# Patient Record
Sex: Male | Born: 1965 | Race: Black or African American | Hispanic: No | Smoking: Never smoker
Health system: Southern US, Community
[De-identification: ages and names within clinical notes are randomized; demographics above are authoritative.]

## PROBLEM LIST (undated history)

## (undated) DIAGNOSIS — E669 Obesity, unspecified: Secondary | ICD-10-CM

## (undated) DIAGNOSIS — G4730 Sleep apnea, unspecified: Secondary | ICD-10-CM

## (undated) DIAGNOSIS — I10 Essential (primary) hypertension: Secondary | ICD-10-CM

## (undated) HISTORY — PX: THROAT SURGERY: SHX803

---

## 2008-05-07 ENCOUNTER — Ambulatory Visit: Payer: Self-pay | Admitting: Unknown Physician Specialty

## 2016-11-25 ENCOUNTER — Observation Stay
Admission: EM | Admit: 2016-11-25 | Discharge: 2016-11-26 | Disposition: A | Discharge status: Home / Self Care | Payer: Managed Care, Other (non HMO) | Attending: Internal Medicine | Admitting: Internal Medicine

## 2016-11-25 ENCOUNTER — Encounter: Payer: Self-pay | Admitting: Emergency Medicine

## 2016-11-25 ENCOUNTER — Emergency Department: Payer: Managed Care, Other (non HMO)

## 2016-11-25 ENCOUNTER — Ambulatory Visit (INDEPENDENT_AMBULATORY_CARE_PROVIDER_SITE_OTHER)
Admission: EM | Admit: 2016-11-25 | Discharge: 2016-11-25 | Disposition: A | Discharge status: Other Acute Inpatient Hospital | Payer: Managed Care, Other (non HMO) | Source: Home / Self Care | Attending: Family Medicine | Admitting: Family Medicine

## 2016-11-25 DIAGNOSIS — G4730 Sleep apnea, unspecified: Secondary | ICD-10-CM | POA: Diagnosis present

## 2016-11-25 DIAGNOSIS — G4733 Obstructive sleep apnea (adult) (pediatric): Secondary | ICD-10-CM | POA: Diagnosis not present

## 2016-11-25 DIAGNOSIS — Z6841 Body Mass Index (BMI) 40.0 and over, adult: Secondary | ICD-10-CM | POA: Diagnosis not present

## 2016-11-25 DIAGNOSIS — R0681 Apnea, not elsewhere classified: Secondary | ICD-10-CM

## 2016-11-25 DIAGNOSIS — I10 Essential (primary) hypertension: Secondary | ICD-10-CM

## 2016-11-25 DIAGNOSIS — I469 Cardiac arrest, cause unspecified: Secondary | ICD-10-CM

## 2016-11-25 DIAGNOSIS — E669 Obesity, unspecified: Secondary | ICD-10-CM | POA: Diagnosis not present

## 2016-11-25 DIAGNOSIS — I499 Cardiac arrhythmia, unspecified: Secondary | ICD-10-CM

## 2016-11-25 HISTORY — DX: Obesity, unspecified: E66.9

## 2016-11-25 HISTORY — DX: Sleep apnea, unspecified: G47.30

## 2016-11-25 HISTORY — DX: Essential (primary) hypertension: I10

## 2016-11-25 LAB — BASIC METABOLIC PANEL
Anion gap: 8 (ref 5–15)
BUN: 14 mg/dL (ref 6–20)
CALCIUM: 8.8 mg/dL — AB (ref 8.9–10.3)
CO2: 27 mmol/L (ref 22–32)
CREATININE: 0.93 mg/dL (ref 0.61–1.24)
Chloride: 103 mmol/L (ref 101–111)
GFR calc Af Amer: >60 mL/min (ref >60)
Glucose, Bld: 99 mg/dL (ref 65–99)
Potassium: 3.6 mmol/L (ref 3.5–5.1)
SODIUM: 138 mmol/L (ref 135–145)

## 2016-11-25 LAB — CBC
HCT: 43 % (ref 40.0–52.0)
Hemoglobin: 14 g/dL (ref 13.0–18.0)
MCH: 25.4 pg — ABNORMAL LOW (ref 26.0–34.0)
MCHC: 32.5 g/dL (ref 32.0–36.0)
MCV: 78.4 fL — ABNORMAL LOW (ref 80.0–100.0)
PLATELETS: 269 10*3/uL (ref 150–440)
RBC: 5.49 MIL/uL (ref 4.40–5.90)
RDW: 15.1 % — AB (ref 11.5–14.5)
WBC: 4.7 10*3/uL (ref 3.8–10.6)

## 2016-11-25 LAB — TROPONIN I: Troponin I: <0.03 ng/mL (ref <0.03)

## 2016-11-25 LAB — TSH: TSH: 1.227 u[IU]/mL (ref 0.350–4.500)

## 2016-11-25 MED ORDER — ASPIRIN EC 81 MG PO TBEC
81.0000 mg | DELAYED_RELEASE_TABLET | Freq: Every day | ORAL | Status: DC
  Administered 2016-11-26: 81 mg via ORAL
  Filled 2016-11-25: qty 1

## 2016-11-25 MED ORDER — HEPARIN SODIUM (PORCINE) 5000 UNIT/ML IJ SOLN
5000.0000 [IU] | Freq: Three times a day (TID) | INTRAMUSCULAR | Status: DC
  Administered 2016-11-25 – 2016-11-26 (×2): 5000 [IU] via SUBCUTANEOUS
  Filled 2016-11-25 (×2): qty 1

## 2016-11-25 MED ORDER — SODIUM CHLORIDE 0.9% FLUSH
3.0000 mL | Freq: Two times a day (BID) | INTRAVENOUS | Status: DC
  Administered 2016-11-25: 3 mL via INTRAVENOUS

## 2016-11-25 MED ORDER — BISACODYL 10 MG RE SUPP: 10.0000 mg | Freq: Every day | RECTAL | Status: DC | PRN

## 2016-11-25 MED ORDER — LOSARTAN POTASSIUM 50 MG PO TABS
50.0000 mg | ORAL_TABLET | Freq: Every day | ORAL | Status: DC
  Administered 2016-11-25 – 2016-11-26 (×2): 50 mg via ORAL
  Filled 2016-11-25 (×2): qty 1

## 2016-11-25 MED ORDER — IPRATROPIUM-ALBUTEROL 0.5-2.5 (3) MG/3ML IN SOLN
3.0000 mL | Freq: Four times a day (QID) | RESPIRATORY_TRACT | Status: DC
  Administered 2016-11-25: 3 mL via RESPIRATORY_TRACT
  Filled 2016-11-25: qty 3

## 2016-11-25 MED ORDER — ACETAMINOPHEN 650 MG RE SUPP
650.0000 mg | Freq: Four times a day (QID) | RECTAL | Status: DC | PRN
Start: 2016-11-25 — End: 2016-11-26

## 2016-11-25 MED ORDER — ONDANSETRON HCL 4 MG PO TABS: 4.0000 mg | ORAL_TABLET | Freq: Four times a day (QID) | ORAL | Status: DC | PRN

## 2016-11-25 MED ORDER — DOCUSATE SODIUM 100 MG PO CAPS
100.0000 mg | ORAL_CAPSULE | Freq: Two times a day (BID) | ORAL | Status: DC
  Administered 2016-11-26: 100 mg via ORAL
  Filled 2016-11-25: qty 1

## 2016-11-25 MED ORDER — PANTOPRAZOLE SODIUM 40 MG PO TBEC
40.0000 mg | DELAYED_RELEASE_TABLET | Freq: Every day | ORAL | Status: DC
  Administered 2016-11-25 – 2016-11-26 (×2): 40 mg via ORAL
  Filled 2016-11-25 (×2): qty 1

## 2016-11-25 MED ORDER — ACETAMINOPHEN 325 MG PO TABS: 650.0000 mg | ORAL_TABLET | Freq: Four times a day (QID) | ORAL | Status: DC | PRN

## 2016-11-25 MED ORDER — ONDANSETRON HCL 4 MG/2ML IJ SOLN: 4.0000 mg | Freq: Four times a day (QID) | INTRAMUSCULAR | Status: DC | PRN

## 2016-11-25 NOTE — Progress Notes (Signed)
Overnight pulse-ox hooked up, pt on room air.

## 2016-11-25 NOTE — Discharge Instructions (Signed)
Recommend patient go to ED for further evaluation and management °

## 2016-11-25 NOTE — ED Triage Notes (Signed)
Patient states that he had a sleep study done last night.  Paitent states that his heart stopped beating during the sleep study last night.  Patient states that he was told that he should follow-up at the ER this morning.  Patient reports no symptoms at this time.  Patient denies chest pain or SOB.

## 2016-11-25 NOTE — H&P (Signed)
History and Physical    Jerry Conley [id number] DOB: November 23, 1965 DOA: 11/25/2016  Referring physician: Dr. Alfred Levins PCP: [facility], Chrissie Noa, MD  Specialists: none  Chief Complaint: sleep issues  HPI: Jerry Conley is a 51 y.o. male has a past medical history significant for obesity and borderline HTN who has been having sleep issues with frequent awakenings. Had a sleep study done last night with 6 second episode of asystole and was sent to ER. Pt denies CP or SOB. No palpitations. No syncope or near syncope. He is now admitted  Review of Systems: The patient denies anorexia, fever, weight loss,, vision loss, decreased hearing, hoarseness, chest pain, syncope, dyspnea on exertion, peripheral edema, balance deficits, hemoptysis, abdominal pain, melena, hematochezia, severe indigestion/heartburn, hematuria, incontinence, genital sores, muscle weakness, suspicious skin lesions, transient blindness, difficulty walking, depression, unusual weight change, abnormal bleeding, enlarged lymph nodes, angioedema, and breast masses.   History reviewed. No pertinent past medical history. Past Surgical History:  Procedure Laterality Date  . THROAT SURGERY     Social History:  reports that he has never smoked. He has never used smokeless tobacco. He reports that he drinks alcohol. He reports that he does not use drugs.  No Known Allergies  History reviewed. No pertinent family history.  Prior to Admission medications   Not on File   Physical Exam: Vitals:   11/25/16 0955  BP: (!) 151/121  Pulse: 92  Resp: 20  Temp: 98.4 F (36.9 C)  TempSrc: Oral  SpO2: 100%  Weight: (!) 145.2 kg (320 lb)  Height: 6\' 1"  (1.854 m)     General:  No apparent distress, WDWN, /AT  Eyes: PERRL, EOMI, no scleral icterus, conjunctiva clear  ENT: moist oropharynx without exudate, TM's benign, dentition good  Neck: supple, no lymphadenopathy. No bruits or thyromegaly  Cardiovascular: regular rate  without MRG; 2+ peripheral pulses, no JVD, no peripheral edema  Respiratory: CTA biL, good air movement without wheezing, rhonchi or crackled. Respiratory effort normal  Abdomen: soft, non tender to palpation, positive bowel sounds, no guarding, no rebound  Skin: no rashes or lesions  Musculoskeletal: normal bulk and tone, no joint swelling  Psychiatric: normal mood and affect, A&OX3  Neurologic: CN 2-12 grossly intact, Motor strength 5/5 in all 4 groups with symmetric DTR's and non-focal sensory exam  Labs on Admission:  Basic Metabolic Panel:  Recent Labs Lab 11/25/16 0956  NA 138  K 3.6  CL 103  CO2 27  GLUCOSE 99  BUN 14  CREATININE 0.93  CALCIUM 8.8*   Liver Function Tests: No results for input(s): AST, ALT, ALKPHOS, BILITOT, PROT, ALBUMIN in the last 168 hours. No results for input(s): LIPASE, AMYLASE in the last 168 hours. No results for input(s): AMMONIA in the last 168 hours. CBC:  Recent Labs Lab 11/25/16 0956  WBC 4.7  HGB 14.0  HCT 43.0  MCV 78.4*  PLT 269   Cardiac Enzymes:  Recent Labs Lab 11/25/16 0956  TROPONINI <0.03    BNP (last 3 results) No results for input(s): BNP in the last 8760 hours.  ProBNP (last 3 results) No results for input(s): PROBNP in the last 8760 hours.  CBG: No results for input(s): GLUCAP in the last 168 hours.  Radiological Exams on Admission: Dg Chest 2 View  Result Date: 11/25/2016 CLINICAL DATA:  Sleep apnea, second degree heart block, and 5.73 second episode of asystole detected during a sleep study EXAM: CHEST  2 VIEW COMPARISON:  None FINDINGS: Normal  heart size, mediastinal contours, and pulmonary vascularity. Lungs clear. No pleural effusion or pneumothorax. Bones unremarkable. IMPRESSION: Normal exam. Electronically Signed   By: Lavonia Dana M.D.   On: 11/25/2016 11:28    EKG: Independently reviewed.  Assessment/Plan Principal Problem:   Asystole (HCC) Active Problems:   OSA (obstructive sleep  apnea)   Obesity   HTN (hypertension)   Will observe on telemetry and follow oxygen closely. Echo ordered. Consult Pulmonology and Cardiology. Repeat labs in AM.  Diet: low salt Fluids: saline lock DVT Prophylaxis: SQ Heparin  Code Status: FULL  Family Communication: none  Disposition Plan: home  Time spent: 50 min

## 2016-11-25 NOTE — ED Triage Notes (Signed)
Patient presents to the ED from [facility] care.  Patient was sent to Urgent Care after having a sleep study done that found he had 25 second obstructive apnea with 2nd degree heart block and a 5.73 second asystole during the episode of apnea.  Patient is alert and oriented x 4.  Denies chest pain or shortness of breath.  No obvious distress at this time.

## 2016-11-25 NOTE — ED Provider Notes (Signed)
[facility]    CSN: [id number] Arrival date & time: 11/25/16  0810     History   Chief Complaint Chief Complaint  Patient presents with  . Heart Problem    HPI Jerry Conley is a 51 y.o. male.   51 yo male presents with a c/o "heart issues". Patient was having a sleep study last night and was found to have episodes of 2nd degree heart block during the sleep study as well as a 5 second asystole episode according to a written note by the sleep study technician. Patient states he was told to go to the Emergency Department for further evaluation. He asked if urgent care was ok and states was told yes. Currently denies any symptoms. Denies chest pains, palpitations, shortness of breath. States he feels fine. States that for many months he's had nighttime awakening episodes.    The history is provided by the patient.    History reviewed. No pertinent past medical history.  There are no active problems to display for this patient.   Past Surgical History:  Procedure Laterality Date  . THROAT SURGERY         Home Medications    Prior to Admission medications   Not on File    Family History History reviewed. No pertinent family history.  Social History Social History  Substance Use Topics  . Smoking status: Never Smoker  . Smokeless tobacco: Never Used  . Alcohol use Yes     Allergies   Patient has no known allergies.   Review of Systems Review of Systems   Physical Exam Triage Vital Signs ED Triage Vitals  Enc Vitals Group     BP 11/25/16 0835 (!) 145/86     Pulse Rate 11/25/16 0835 82     Resp 11/25/16 0835 16     Temp 11/25/16 0835 98.2 F (36.8 C)     Temp Source 11/25/16 0835 Oral     SpO2 11/25/16 0835 100 %     Weight 11/25/16 0825 (!) 320 lb (145.2 kg)     Height 11/25/16 0825 6\' 1"  (1.854 m)     Head Circumference --      Peak Flow --      Pain Score 11/25/16 0826 0     Pain Loc --      Pain Edu? --      Excl. in [place]? --     No data found.   Updated Vital Signs BP (!) 145/86 (BP Location: Left Arm)   Pulse 82   Temp 98.2 F (36.8 C) (Oral)   Resp 16   Ht 6\' 1"  (1.854 m)   Wt (!) 320 lb (145.2 kg)   SpO2 100%   BMI 42.22 kg/m   Visual Acuity Right Eye Distance:   Left Eye Distance:   Bilateral Distance:    Right Eye Near:   Left Eye Near:    Bilateral Near:     Physical Exam  Constitutional: He appears well-developed and well-nourished. No distress.  Cardiovascular: Normal rate, regular rhythm, normal heart sounds and intact distal pulses.   Pulmonary/Chest: Effort normal and breath sounds normal. No respiratory distress. He has no wheezes. He has no rales.  Abdominal:  obese  Skin: He is not diaphoretic.  Nursing note and vitals reviewed.    UC Treatments / Results  Labs (all labs ordered are listed, but only abnormal results are displayed) Labs Reviewed - No data to display  EKG  EKG Interpretation  None       Radiology No results found.  Procedures ED EKG Date/Time: 11/25/2016 9:22 AM Performed by: Norval Gable Authorized by: Norval Gable   ECG reviewed by ED Physician in the absence of a cardiologist: yes   Previous ECG:    Previous ECG:  Unavailable Interpretation:    Interpretation: normal   Rate:    ECG rate assessment: normal   Rhythm:    Rhythm: sinus rhythm   Ectopy:    Ectopy: none   QRS:    QRS axis:  Normal Conduction:    Conduction: normal   ST segments:    ST segments:  Normal T waves:    T waves: normal     (including critical care time)  Medications Ordered in UC Medications - No data to display   Initial Impression / Assessment and Plan / UC Course  I have reviewed the triage vital signs and the nursing notes.  Pertinent labs & imaging results that were available during my care of the patient were reviewed by me and considered in my medical decision making (see chart for details).       Final Clinical Impressions(s) / UC  Diagnoses   Final diagnoses:  Cardiac arrhythmia, unspecified cardiac arrhythmia type  Sleep apnea, unspecified type   (patient currently asymptomatic)  New Prescriptions There are no discharge medications for this patient.  1. ekg result (normal currently) and diagnosis reviewed with patient; discussed with patient potential serious risks if having sleep apneic episodes associated with heart block and asystole episodes. Recommend patient go to Emergency Department for further evaluation and management. Report called to triage RN at [facility] ED.    Norval Gable, MD 11/25/16 [phone]

## 2016-11-25 NOTE — Care Management Note (Addendum)
Case Management Note  Patient Details  Name: Jerry Conley MRN: [id number] Date of Birth: August 21, 1966  Subjective/Objective:       This Probation officer received a call from the ED-CM requesting information about how to obtain a CPAP machine for [name] today. [name] evidently experienced some medical problems while participating in a sleep study yesterday.. This Probation officer called the [place], [place], at [phone], and requested that he call the ED-CM to discuss the specifics of the request for a CPAP machine. Mr Rocks has [facility].              Action/Plan:   Expected Discharge Date:                  Expected Discharge Plan:     In-House Referral:     Discharge planning Services     Post Acute Care Choice:    Choice offered to:     DME Arranged:    DME Agency:     HH Arranged:    HH Agency:     Status of Service:     If discussed at [employer] of Stay Meetings, dates discussed:    Additional Comments:  Libby Goehring A, RN 11/25/2016, 1:10 PM

## 2016-11-25 NOTE — Care Management Note (Signed)
Case Management Note  Patient Details  Name: Jerry Conley MRN: [id number] Date of Birth: 1966/09/27  Subjective/Objective:  CPAP inquiry                  Action/Plan:Spoke with [name] with [facility] [phone] unabe to deliver CPAP today. Patient will need additional sleep study to determine settings. Monitor tonight . Pulmonary consult.cardiology consult and echo pending.   Expected Discharge Date:                  Expected Discharge Plan:     In-House Referral:     Discharge planning Services     Post Acute Care Choice:    Choice offered to:     DME Arranged:    DME Agency:     HH Arranged:    HH Agency:     Status of Service:     If discussed at [employer] of [employer], dates discussed:    Additional Comments:  Ival Bible, RN 11/25/2016, 2:22 PM

## 2016-11-25 NOTE — ED Provider Notes (Signed)
[facility] Emergency Department Provider Note  ____________________________________________  Time seen: Approximately 1:26 PM  I have reviewed the triage vital signs and the nursing notes.   HISTORY  Chief Complaint Irregular Heart Beat   HPI Jerry Conley is a 51 y.o. male with no significant past medical history who presents for evaluation of asystolic pause during a sleep study that happened last night. Patient was sent by his primary care doctor for sleep study. This morning he was told to come to the emergency room because he had a 5.7 second asystole during an episode of apnea overnight. Patient has no complaints at this time. He denies chest pain, shortness of breath, headache, abdominal pain, nausea, vomiting. Patient denies any past medical history. No family history of ischemic heart disease. He is not a smoker.  History reviewed. No pertinent past medical history.  Patient Active Problem List   Diagnosis Date Noted  . OSA (obstructive sleep apnea) 11/25/2016  . Asystole ([place]) 11/25/2016  . Obesity 11/25/2016  . HTN (hypertension) 11/25/2016    Past Surgical History:  Procedure Laterality Date  . THROAT SURGERY      Prior to Admission medications   Not on File    Allergies Patient has no known allergies.  History reviewed. No pertinent family history.  Social History Social History  Substance Use Topics  . Smoking status: Never Smoker  . Smokeless tobacco: Never Used  . Alcohol use Yes    Review of Systems  Constitutional: Negative for fever. Eyes: Negative for visual changes. ENT: Negative for sore throat. Neck: No neck pain  Cardiovascular: Negative for chest pain. Respiratory: Negative for shortness of breath. Gastrointestinal: Negative for abdominal pain, vomiting or diarrhea. Genitourinary: Negative for dysuria. Musculoskeletal: Negative for back pain. Skin: Negative for rash. Neurological: Negative for headaches,  weakness or numbness. Psych: No SI or HI  ____________________________________________   PHYSICAL EXAM:  VITAL SIGNS: ED Triage Vitals [11/25/16 0955]  Enc Vitals Group     BP (!) 151/121     Pulse Rate 92     Resp 20     Temp 98.4 F (36.9 C)     Temp Source Oral     SpO2 100 %     Weight (!) 320 lb (145.2 kg)     Height 6\' 1"  (1.854 m)     Head Circumference      Peak Flow      Pain Score 0     Pain Loc      Pain Edu?      Excl. in [place]?     Constitutional: Alert and oriented. Well appearing and in no apparent distress. HEENT:      Head: Normocephalic and atraumatic.         Eyes: Conjunctivae are normal. Sclera is non-icteric. EOMI. PERRL      Mouth/Throat: Mucous membranes are moist.       Neck: Supple with no signs of meningismus. Cardiovascular: Regular rate and rhythm. No murmurs, gallops, or rubs. 2+ symmetrical distal pulses are present in all extremities. No JVD. Respiratory: Normal respiratory effort. Lungs are clear to auscultation bilaterally. No wheezes, crackles, or rhonchi.  Gastrointestinal: Soft, non tender, and non distended with positive bowel sounds. No rebound or guarding. Musculoskeletal: Nontender with normal range of motion in all extremities. No edema, cyanosis, or erythema of extremities. Neurologic: Normal speech and language. Face is symmetric. Moving all extremities. No gross focal neurologic deficits are appreciated. Skin: Skin is warm,  dry and intact. No rash noted. Psychiatric: Mood and affect are normal. Speech and behavior are normal.  ____________________________________________   LABS (all labs ordered are listed, but only abnormal results are displayed)  Labs Reviewed  BASIC METABOLIC PANEL - Abnormal; Notable for the following:       Result Value   Calcium 8.8 (*)    All other components within normal limits  CBC - Abnormal; Notable for the following:    MCV 78.4 (*)    MCH 25.4 (*)    RDW 15.1 (*)    All other components  within normal limits  TROPONIN I   ____________________________________________  EKG  ED ECG REPORT I, Rudene Re, the attending physician, personally viewed and interpreted this ECG.  Normal sinus rhythm, rate of 79, normal intervals, normal axis, no ST elevations or depressions. Normal EKG. ____________________________________________  RADIOLOGY  none  ____________________________________________   PROCEDURES  Procedure(s) performed: None Procedures Critical Care performed:  None ____________________________________________   INITIAL IMPRESSION / ASSESSMENT AND PLAN / ED COURSE  51 y.o. male with no significant past medical history who presents for evaluation of asystolic pause during a sleep study that happened last night. Patient is asymptomatic at this time, physical exam and no acute findings, normal vital signs, normal EKG. Patient was placed on telemetry for monitoring. We'll discuss with case manager to see for unable to get patient CPAP from the emergency department. Spoke with Dr. Velora Heckler, pulmonologist on call who recommended admitting patient under obs overnight so we can be monitored overnight on CPAP and have CPAP arranged for tomorrow,.     Pertinent labs & imaging results that were available during my care of the patient were reviewed by me and considered in my medical decision making (see chart for details).    ____________________________________________   FINAL CLINICAL IMPRESSION(S) / ED DIAGNOSES  Final diagnoses:  Episode of apnea  Asystole ([place])      NEW MEDICATIONS STARTED DURING THIS VISIT:  New Prescriptions   No medications on file     Note:  This document was prepared using Dragon voice recognition software and may include unintentional dictation errors.    Rudene Re, MD 11/25/16 [phone]

## 2016-11-26 ENCOUNTER — Observation Stay (HOSPITAL_BASED_OUTPATIENT_CLINIC_OR_DEPARTMENT_OTHER)
Admit: 2016-11-26 | Discharge: 2016-11-26 | Disposition: A | Discharge status: Home / Self Care | Payer: Managed Care, Other (non HMO) | Attending: Internal Medicine | Admitting: Internal Medicine

## 2016-11-26 ENCOUNTER — Encounter: Payer: Self-pay | Admitting: Physician Assistant

## 2016-11-26 ENCOUNTER — Telehealth: Payer: Self-pay | Admitting: *Deleted

## 2016-11-26 DIAGNOSIS — E669 Obesity, unspecified: Secondary | ICD-10-CM | POA: Insufficient documentation

## 2016-11-26 DIAGNOSIS — J989 Respiratory disorder, unspecified: Secondary | ICD-10-CM

## 2016-11-26 DIAGNOSIS — I469 Cardiac arrest, cause unspecified: Principal | ICD-10-CM | POA: Insufficient documentation

## 2016-11-26 DIAGNOSIS — I455 Other specified heart block: Secondary | ICD-10-CM

## 2016-11-26 DIAGNOSIS — Z6841 Body Mass Index (BMI) 40.0 and over, adult: Secondary | ICD-10-CM | POA: Insufficient documentation

## 2016-11-26 DIAGNOSIS — G4733 Obstructive sleep apnea (adult) (pediatric): Secondary | ICD-10-CM | POA: Insufficient documentation

## 2016-11-26 DIAGNOSIS — I10 Essential (primary) hypertension: Secondary | ICD-10-CM | POA: Insufficient documentation

## 2016-11-26 LAB — COMPREHENSIVE METABOLIC PANEL
ALK PHOS: 51 U/L (ref 38–126)
ALT: 31 U/L (ref 17–63)
ANION GAP: 7 (ref 5–15)
AST: 27 U/L (ref 15–41)
Albumin: 3.6 g/dL (ref 3.5–5.0)
BUN: 14 mg/dL (ref 6–20)
CALCIUM: 8.7 mg/dL — AB (ref 8.9–10.3)
CHLORIDE: 104 mmol/L (ref 101–111)
CO2: 29 mmol/L (ref 22–32)
Creatinine, Ser: 0.98 mg/dL (ref 0.61–1.24)
GFR calc non Af Amer: >60 mL/min (ref >60)
Glucose, Bld: 89 mg/dL (ref 65–99)
Potassium: 3.7 mmol/L (ref 3.5–5.1)
SODIUM: 140 mmol/L (ref 135–145)
Total Bilirubin: 0.7 mg/dL (ref 0.3–1.2)
Total Protein: 6.9 g/dL (ref 6.5–8.1)

## 2016-11-26 LAB — CBC
HCT: 41 % (ref 40.0–52.0)
HEMOGLOBIN: 13.2 g/dL (ref 13.0–18.0)
MCH: 25.5 pg — AB (ref 26.0–34.0)
MCHC: 32.3 g/dL (ref 32.0–36.0)
MCV: 78.8 fL — ABNORMAL LOW (ref 80.0–100.0)
Platelets: 246 10*3/uL (ref 150–440)
RBC: 5.2 MIL/uL (ref 4.40–5.90)
RDW: 15.1 % — ABNORMAL HIGH (ref 11.5–14.5)
WBC: 4.9 10*3/uL (ref 3.8–10.6)

## 2016-11-26 LAB — ECHOCARDIOGRAM COMPLETE
Height: 73 in
WEIGHTICAEL: 5153.6 [oz_av]

## 2016-11-26 MED ORDER — IPRATROPIUM-ALBUTEROL 0.5-2.5 (3) MG/3ML IN SOLN: 3.0000 mL | Freq: Four times a day (QID) | RESPIRATORY_TRACT | Status: DC | PRN

## 2016-11-26 NOTE — Telephone Encounter (Signed)
TCM call. Patient currently admitted at 11/26/16 at 10:58 am. Discharge summary with instructions on medications not complete yet. Will call patient later.

## 2016-11-26 NOTE — Progress Notes (Signed)
Pt to be discharged today. Iv and tele removed. disch instructions given to pt to his understanding. disch via w.c.

## 2016-11-26 NOTE — Telephone Encounter (Signed)
Patient contacted regarding discharge from [facility] on 11/26/16.  Patient understands to follow up with provider ? On 12/04/16 at 4:00pm at [facility].  Patient understands discharge instructions? Yes  Patient understands medications and regiment? Yes, patient is not on any medications. Patient understands to bring all medications to this visit? Yes, patient is not on any medications.  Patient concerned because he was on CPAP in the hospital and does not have the equipment for tonight. Patient looked on discharge paperwork and saw a number for sleep clinic. Also, discharge summary lists [facility] is where CPAP is ordered for autotitration of 5-20 at home. Phone number for [place] given to patient and advised him to call the Sleep number and [facility] and to call us if he has any additional questions. He verbalized understanding.

## 2016-11-26 NOTE — Care Management (Signed)
Order placed for CPAP auto titration 5-20.  Patient to discharge home today.  Referral for [facility] made to [facility].  [facility] to follow up with patient to arranged CPAP in the home.  Patient aware that he needs to follow up with the outpatient sleep study to arrange the second portion.

## 2016-11-26 NOTE — Progress Notes (Signed)
*  PRELIMINARY RESULTS* Echocardiogram 2D Echocardiogram has been performed.  Jerry Conley 11/26/2016, 11:21 AM

## 2016-11-26 NOTE — Discharge Summary (Addendum)
[place] [facility] [place] NAME: Jerry Conley    MR#:  [id number]  DATE OF BIRTH:  [birth year]  DATE OF ADMISSION:  11/25/2016 ADMITTING PHYSICIAN: Idelle Crouch, MD  DATE OF DISCHARGE: 11/26/2016 PRIMARY CARE PHYSICIAN: FELDPAUSCH, DALE E, MD    ADMISSION DIAGNOSIS:  Asystole ([place]) [I46.9] Episode of apnea [R06.81]  DISCHARGE DIAGNOSIS:  Principal Problem:   Sleep apnea Active Problems:   Obesity   HTN (hypertension)   SECONDARY DIAGNOSIS:   Past Medical History:  Diagnosis Date  . Hypertension   . Obesity   . Sleep apnea     HOSPITAL COURSE:  51 year old male with obesity who apparently had a cardiac event during a sleep study.  1. Reported sinus positive: Patient has been evaluated by cardiology. No strips are available for review. Patient has had no significant events on our telemetry monitoring. Etiology is due to underlying sleep apnea. CPAP is ordered on autotitration (5-20) at home throught [place] Patient will follow-up with cardiology for echocardiogram results.  2. Essential hypertension: Patient reports he does not take medications. Patient will need a follow-up with cardiology   DISCHARGE CONDITIONS AND DIET:   Stable   CONSULTS OBTAINED:  Treatment Team:  Will Meredith Leeds, MD Allyne Gee, MD  DRUG ALLERGIES:  No Known Allergies  DISCHARGE MEDICATIONS:  There are no discharge medications for this patient.     Today   CHIEF COMPLAINT:  Regular cardiac diet   VITAL SIGNS:  Blood pressure 129/73, pulse 78, temperature 98.2 F (36.8 C), temperature source Oral, resp. rate 16, height 6\' 1"  (1.854 m), weight (!) 146.1 kg (322 lb 1.6 oz), SpO2 97 %.   REVIEW OF SYSTEMS:  Review of Systems  Constitutional: Negative.  Negative for chills, fever and malaise/fatigue.  HENT: Negative.  Negative for ear discharge, ear pain, hearing loss, nosebleeds and sore throat.   Eyes: Negative.   Negative for blurred vision and pain.  Respiratory: Negative.  Negative for cough, hemoptysis, shortness of breath and wheezing.   Cardiovascular: Negative.  Negative for chest pain, palpitations and leg swelling.  Gastrointestinal: Negative.  Negative for abdominal pain, blood in stool, diarrhea, nausea and vomiting.  Genitourinary: Negative.  Negative for dysuria.  Musculoskeletal: Negative.  Negative for back pain.  Skin: Negative.   Neurological: Negative for dizziness, tremors, speech change, focal weakness, seizures and headaches.  Endo/Heme/Allergies: Negative.  Does not bruise/bleed easily.  Psychiatric/Behavioral: Negative.  Negative for depression, hallucinations and suicidal ideas.     PHYSICAL EXAMINATION:  GENERAL:  51 y.o.-year-old patient lying in the bed with no acute distress.  NECK:  Supple, no jugular venous distention. No thyroid enlargement, no tenderness.  LUNGS: Normal breath sounds bilaterally, no wheezing, rales,rhonchi  No use of accessory muscles of respiration.  CARDIOVASCULAR: S1, S2 normal. No murmurs, rubs, or gallops.  ABDOMEN: Soft, non-tender, non-distended. Bowel sounds present. No organomegaly or mass.  EXTREMITIES: No pedal edema, cyanosis, or clubbing.  PSYCHIATRIC: The patient is alert and oriented x 3.  SKIN: No obvious rash, lesion, or ulcer.   DATA REVIEW:   CBC  Recent Labs Lab 11/26/16 0550  WBC 4.9  HGB 13.2  HCT 41.0  PLT 246    Chemistries   Recent Labs Lab 11/26/16 0550  NA 140  K 3.7  CL 104  CO2 29  GLUCOSE 89  BUN 14  CREATININE 0.98  CALCIUM 8.7*  AST 27  ALT 31  ALKPHOS 51  BILITOT 0.7    Cardiac Enzymes  Recent Labs Lab 11/25/16 0956  TROPONINI <0.03    Microbiology Results  @MICRORSLT48 @  RADIOLOGY:  Dg Chest 2 View  Result Date: 11/25/2016 CLINICAL DATA:  Sleep apnea, second degree heart block, and 5.73 second episode of asystole detected during a sleep study EXAM: CHEST  2 VIEW COMPARISON:   None FINDINGS: Normal heart size, mediastinal contours, and pulmonary vascularity. Lungs clear. No pleural effusion or pneumothorax. Bones unremarkable. IMPRESSION: Normal exam. Electronically Signed   By: Lavonia Dana M.D.   On: 11/25/2016 11:28      There are no discharge medications for this patient.   Management plans discussed with the patient and he is in agreement. Stable for discharge home   CODE STATUS:     Code Status Orders        Start     Ordered   11/25/16 1634  Full code  Continuous     11/25/16 1634    Code Status History    Date Active Date Inactive Code Status Order ID Comments User Context   This patient has a current code status but no historical code status.      TOTAL TIME TAKING CARE OF THIS PATIENT: 37 minutes.    Note: This dictation was prepared with Dragon dictation along with smaller phrase technology. Any transcriptional errors that result from this process are unintentional.  Jamyrah Saur M.D on 11/26/2016 at 9:43 AM  Between 7am to 6pm - Pager - [phone] After 6pm go to [email or web address] - password [email or web address] [place] [facility]  Office  [phone]  CC: Primary care physician; [facility], Chrissie Noa, MD

## 2016-11-26 NOTE — Telephone Encounter (Signed)
-----   Message from Blain Pais sent at 11/26/2016 10:12 AM EST ----- Regarding: tcm/ph 2/27 Dr. Fletcher Anon 4:00

## 2016-11-26 NOTE — Consult Note (Signed)
Cardiology Consultation Note  Patient ID: Jerry Conley, MRN: [id number], DOB/AGE: [birth year] 50 y.o. Admit date: 11/25/2016   Date of Consult: 11/26/2016 Primary Physician: [name], MD Primary Cardiologist: New to [facility] Requesting Physician: Dr. Doy Hutching, MD  Chief Complaint: Sleep apnea Reason for Consult: reported pause of 6 seconds during sleep study showing sleep apnea (nothing available for review)  HPI: 51 y.o. male with h/o HTN, sleep apnea, and obesity who presented to [facility] after an abnormal sleep study showing sleep apnea and a reported pause during apneic event.   Patient does not have any previously known cardiac history. He underwent a sleep study on the night of 2/16 that showed sleep apnea. While sleeping he reportedly ahd a 5.8 second pause per notes and patient. There are no strips for review. He was advised by the sleep tech to come to the hospital where he was admitted for the above and cardiology and pulmonary consults were placed. Since arriving he has been in sinus rhythm with his longest pause of 2.25 seconds while napping this morning. Labs were unremarkable. Echo is pending. Never with any syncope, presyncope, chest pain, SOB, diaphoresis, or palpitations. CXR negative. EKG not acute as below.    Past Medical History:  Diagnosis Date  . Hypertension   . Obesity   . Sleep apnea       Most Recent Cardiac Studies: none   Surgical History:  Past Surgical History:  Procedure Laterality Date  . THROAT SURGERY       Home Meds: Prior to Admission medications   Not on File    Inpatient Medications:  . aspirin EC  81 mg Oral Daily  . docusate sodium  100 mg Oral BID  . heparin  5,000 Units Subcutaneous Q8H  . losartan  50 mg Oral Daily  . pantoprazole  40 mg Oral Daily  . sodium chloride flush  3 mL Intravenous Q12H     Allergies: No Known Allergies  Social History   Social History  . Marital status: Divorced    Spouse name: N/A  . Number of  children: N/A  . Years of education: N/A   Occupational History  . Not on file.   Social History Main Topics  . Smoking status: Never Smoker  . Smokeless tobacco: Never Used  . Alcohol use Yes  . Drug use: No  . Sexual activity: Not on file   Other Topics Concern  . Not on file   Social History Narrative  . No narrative on file     Family History  Problem Relation Age of Onset  . Prostate cancer Father      Review of Systems: Review of Systems  Constitutional: Negative for chills, diaphoresis, fever, malaise/fatigue and weight loss.       Snoring  HENT: Negative for congestion.   Eyes: Negative for discharge and redness.  Respiratory: Negative for cough, hemoptysis, sputum production, shortness of breath and wheezing.   Cardiovascular: Negative for chest pain, palpitations, orthopnea, claudication, leg swelling and PND.  Gastrointestinal: Negative for abdominal pain, blood in stool, heartburn, melena, nausea and vomiting.  Genitourinary: Negative for hematuria.  Musculoskeletal: Negative for falls and myalgias.  Skin: Negative for rash.  Neurological: Negative for dizziness, tingling, tremors, sensory change, speech change, focal weakness, loss of consciousness and weakness.  Endo/Heme/Allergies: Does not bruise/bleed easily.  Psychiatric/Behavioral: Negative for substance abuse. The patient is not nervous/anxious.   All other systems reviewed and are negative.   Labs:  Recent  Labs  11/25/16 0956  TROPONINI <0.03   Lab Results  Component Value Date   WBC 4.9 11/26/2016   HGB 13.2 11/26/2016   HCT 41.0 11/26/2016   MCV 78.8 (L) 11/26/2016   PLT 246 11/26/2016     Recent Labs Lab 11/26/16 0550  NA 140  K 3.7  CL 104  CO2 29  BUN 14  CREATININE 0.98  CALCIUM 8.7*  PROT 6.9  BILITOT 0.7  ALKPHOS 51  ALT 31  AST 27  GLUCOSE 89   No results found for: CHOL, HDL, LDLCALC, TRIG No results found for: DDIMER  Radiology/Studies:  Dg Chest 2  View  Result Date: 11/25/2016 CLINICAL DATA:  Sleep apnea, second degree heart block, and 5.73 second episode of asystole detected during a sleep study EXAM: CHEST  2 VIEW COMPARISON:  None FINDINGS: Normal heart size, mediastinal contours, and pulmonary vascularity. Lungs clear. No pleural effusion or pneumothorax. Bones unremarkable. IMPRESSION: Normal exam. Electronically Signed   By: Lavonia Dana M.D.   On: 11/25/2016 11:28    EKG: Interpreted by me showed: NSR, 74 bpm, poor R wave progression Telemetry: Interpreted by me showed: NSR, 70's bpm, longest pause of 2.25 seconds while napping  Weights: [employer]   11/25/16 0955 11/26/16 0431  Weight: (!) 320 lb (145.2 kg) (!) 322 lb 1.6 oz (146.1 kg)     Physical Exam: Blood pressure 129/73, pulse 78, temperature 98.2 F (36.8 C), temperature source Oral, resp. rate 16, height 6\' 1"  (1.854 m), weight (!) 322 lb 1.6 oz (146.1 kg), SpO2 97 %. Body mass index is 42.5 kg/m. General: Well developed, well nourished, in no acute distress. Head: Normocephalic, atraumatic, sclera non-icteric, no xanthomas, nares are without discharge.  Neck: Negative for carotid bruits. JVD not elevated. Lungs: Clear bilaterally to auscultation without wheezes, rales, or rhonchi. Breathing is unlabored. Heart: RRR with S1 S2. No murmurs, rubs, or gallops appreciated. Abdomen: Soft, non-tender, non-distended with normoactive bowel sounds. No hepatomegaly. No rebound/guarding. No obvious abdominal masses. Msk:  Strength and tone appear normal for age. Extremities: No clubbing or cyanosis. No edema. Distal pedal pulses are 2+ and equal bilaterally. Neuro: Alert and oriented X 3. No facial asymmetry. No focal deficit. Moves all extremities spontaneously. Psych:  Responds to questions appropriately with a normal affect.    Assessment and Plan:  Principal Problem:   Sleep apnea Active Problems:   Obesity   HTN (hypertension)    1. Reported sinus  pause: -No strips for review -Event occurred while sleeping in the setting of sleep study showing sleep apnea -Reported event is almost certainly in the setting of untreated sleep apnea -Has not ahd any significant events on telemetry -Lytes and thyroid function ok -Recommend patient follow up with ordering provider of his sleep study and get this treated -No indications for PPM at this time -Echo pending to evaluate EF   2. HTN: -Controlled    Signed, Christell Faith, PA-C [facility] Pager: [phone] 11/26/2016, 9:18 AM

## 2016-11-27 LAB — HIV ANTIBODY (ROUTINE TESTING W REFLEX): HIV SCREEN 4TH GENERATION: NONREACTIVE

## 2016-11-30 ENCOUNTER — Other Ambulatory Visit
Admission: RE | Admit: 2016-11-30 | Discharge: 2016-11-30 | Disposition: A | Discharge status: Home / Self Care | Payer: Managed Care, Other (non HMO) | Source: Ambulatory Visit | Attending: Urology | Admitting: Urology

## 2016-11-30 ENCOUNTER — Encounter: Payer: Self-pay | Admitting: Urology

## 2016-11-30 ENCOUNTER — Ambulatory Visit (INDEPENDENT_AMBULATORY_CARE_PROVIDER_SITE_OTHER): Payer: Managed Care, Other (non HMO) | Admitting: Urology

## 2016-11-30 VITALS — BP 131/79 | HR 84 | Ht 73.0 in | Wt 326.0 lb

## 2016-11-30 DIAGNOSIS — Z87448 Personal history of other diseases of urinary system: Secondary | ICD-10-CM

## 2016-11-30 DIAGNOSIS — N471 Phimosis: Secondary | ICD-10-CM | POA: Diagnosis not present

## 2016-11-30 DIAGNOSIS — N138 Other obstructive and reflux uropathy: Secondary | ICD-10-CM

## 2016-11-30 DIAGNOSIS — N401 Enlarged prostate with lower urinary tract symptoms: Secondary | ICD-10-CM | POA: Diagnosis not present

## 2016-11-30 DIAGNOSIS — Z125 Encounter for screening for malignant neoplasm of prostate: Secondary | ICD-10-CM | POA: Diagnosis not present

## 2016-11-30 LAB — PSA: PSA: 0.49 ng/mL (ref 0.00–4.00)

## 2016-11-30 LAB — BLADDER SCAN AMB NON-IMAGING: SCAN RESULT: 32

## 2016-11-30 MED ORDER — BETAMETHASONE DIPROPIONATE 0.05 % EX CREA: TOPICAL_CREAM | Freq: Two times a day (BID) | CUTANEOUS | 3 refills | Status: DC

## 2016-11-30 NOTE — Progress Notes (Signed)
11/30/2016 9:49 AM   Jerry Conley 1966-05-07 [id number]  Referring provider: [name], MD [place], [place] [address]  Chief Complaint  Jerry Conley presents with  . New Jerry Conley (Initial Visit)    Phimosis referred by [name]    HPI: Jerry Conley is a 51 year old (American male who is referred to [place] by Jerry Conley primary care physician, Dr. Ellison Hughs, for phimosis.  Jerry Conley is having difficulty pulling back the foreskin for one year.  He is experiencing some minor pain with intercourse and having trouble with maintaining with erections.    Jerry Conley states Jerry Conley foreskin and the glands are discolored.    Jerry Conley IPSS score today is 10, which is moderate lower urinary tract symptomatology.  He is pleased with Jerry Conley quality life due to Jerry Conley urinary symptoms.  Jerry Conley PVR is 32 mL.  Jerry Conley major complaints today are nocturia and urgency during the night.  He has just started sleeping with a CPAP machine.  He has had these symptoms for a few years.    He did have an episode of gross hematuria 6 months ago.  It occurred intermittently for two days.  He is unsure if it was due to the irritation and bleeding from Jerry Conley foreskin.  He denies any dysuria or suprapubic pain.        He also denies any recent fevers, chills, nausea or vomiting.  Jerry Conley paternal grandfather and father have been diagnosed with prostate cancer.  Prostate cancer was fatal in grandfather.  Jerry Conley father had surgery, but he is unsure if any other treatments were done.  Jerry Conley father is still living.        IPSS    Row Name 11/30/16 0900         International Prostate Symptom Score   How often have you had the sensation of not emptying your bladder? Less than 1 in 5     How often have you had to urinate less than every two hours? Less than half the time     How often have you found you stopped and started again several times when you urinated? Less than half the time     How often have  you found it difficult to postpone urination? Less than half the time     How often have you had a weak urinary stream? Less than 1 in 5 times     How often have you had to strain to start urination? Not at All     How many times did you typically get up at night to urinate? 2 Times     Total IPSS Score 10       Quality of Life due to urinary symptoms   If you were to spend the rest of your life with your urinary condition just the way it is now how would you feel about that? Pleased        Score:  1-7 Mild 8-19 Moderate 20-35 Severe     PMH: Past Medical History:  Diagnosis Date  . Hypertension   . Obesity   . Sleep apnea     Surgical History: Past Surgical History:  Procedure Laterality Date  . THROAT SURGERY      Home Medications:  Allergies as of 11/30/2016   No Known Allergies     Medication List       Accurate as of 11/30/16  9:49 AM. Always use your most recent med list.  betamethasone dipropionate 0.05 % cream Commonly known as:  DIPROLENE Apply topically 2 (two) times daily.   ONE-A-DAY SCOOBY-DOO GUMMIES PO Take by mouth.       Allergies: No Known Allergies  Family History: Family History  Problem Relation Age of Onset  . Prostate cancer Father   . Kidney cancer Neg Hx   . Bladder Cancer Neg Hx     Social History:  reports that he has never smoked. He has never used smokeless tobacco. He reports that he drinks alcohol. He reports that he does not use drugs.  ROS: UROLOGY Frequent Urination?: No Hard to postpone urination?: No Burning/pain with urination?: No Get up at night to urinate?: Yes Leakage of urine?: No Urine stream starts and stops?: No Trouble starting stream?: No Do you have to strain to urinate?: No Blood in urine?: No Urinary tract infection?: No Sexually transmitted disease?: No Injury to kidneys or bladder?: No Painful intercourse?: No Weak stream?: No Erection problems?: Yes Penile pain?:  No  Gastrointestinal Nausea?: No Vomiting?: No Indigestion/heartburn?: No Diarrhea?: No Constipation?: No  Constitutional Fever: No Night sweats?: No Weight loss?: No Fatigue?: No  Skin Skin rash/lesions?: No Itching?: No  Eyes Blurred vision?: No Double vision?: No  Ears/Nose/Throat Sore throat?: Yes Sinus problems?: No  Hematologic/Lymphatic Swollen glands?: No Easy bruising?: No  Cardiovascular Leg swelling?: No Chest pain?: No  Respiratory Cough?: No Shortness of breath?: No  Endocrine Excessive thirst?: No  Musculoskeletal Back pain?: Yes Joint pain?: No  Neurological Headaches?: No Dizziness?: No  Psychologic Depression?: No Anxiety?: No  Physical Exam: BP 131/79   Pulse 84   Ht 6\' 1"  (1.854 m)   Wt (!) 326 lb (147.9 kg)   BMI 43.01 kg/m   Constitutional: Well nourished. Alert and oriented, No acute distress. HEENT: [place] AT, moist mucus membranes. Trachea midline, no masses. Cardiovascular: No clubbing, cyanosis, or edema. Respiratory: Normal respiratory effort, no increased work of breathing. GI: Abdomen is soft, non tender, non distended, no abdominal masses. Liver and spleen not palpable.  No hernias appreciated.  Stool sample for occult testing is not indicated.   GU: No CVA tenderness.  No bladder fullness or masses.  Jerry Conley with uncircumcised phallus. Foreskin not easily retracted.  Adhesion to the glands on the ventral side.  Vitiligo is present.   Areas of irritation seen with the appearance of recent bleeding seen on the foreskin.  Urethral meatus is patent.  No penile discharge. No penile lesions or rashes. Scrotum without lesions, cysts, rashes and/or edema.  Testicles are located scrotally bilaterally. No masses are appreciated in the testicles. Left and right epididymis are normal. Rectal: Jerry Conley with  normal sphincter tone. Anus and perineum without scarring or rashes. No rectal masses are appreciated. Prostate is approximately  60 grams, DRE limited to Jerry Conley's body habitus, no nodules are appreciated. Seminal vesicles are normal. Skin: No rashes, bruises or suspicious lesions. Lymph: No cervical or inguinal adenopathy. Neurologic: Grossly intact, no focal deficits, moving all 4 extremities. Psychiatric: Normal mood and affect.  Laboratory Data: Lab Results  Component Value Date   WBC 4.9 11/26/2016   HGB 13.2 11/26/2016   HCT 41.0 11/26/2016   MCV 78.8 (L) 11/26/2016   PLT 246 11/26/2016    Lab Results  Component Value Date   CREATININE 0.98 11/26/2016     Lab Results  Component Value Date   TSH 1.227 11/25/2016    Lab Results  Component Value Date   AST 27 11/26/2016  Lab Results  Component Value Date   ALT 31 11/26/2016   Pertinent Imaging: Results for KYNDAL, WIGGS (MRN [id number]) as of 11/30/2016 09:44  Ref. Range 11/30/2016 09:23  Scan Result Unknown 32    Assessment & Plan:    1. Phimosis  - start betamethasone dipropionate bid  - recheck in 2 weeks  - briefly discussed circumcision -risks and expectations after procedures   2. BPH with LUTS  - IPSS score is 10/1  - Continue conservative management, avoiding bladder irritants and timed voiding's  - Most bothersome symptom is nocturia - just started sleeping with CPAP  - RTC in 12 months for IPSS, PSA and exam   - BLADDER SCAN AMB NON-IMAGING  3. Screening PSA  - I explained to the Jerry Conley that due to Jerry Conley race and strong family history of prostate cancer it is now appropriate to start screening for prostate cancer on a yearly basis  - PSA today  4. History of hematuria  - Jerry Conley had incidences of blood in the urine - unsure if blood was from the irritated foreskin  - Will continue to monitor - check UA when he returns in two weeks if areas of irritated foreskin have improved  Return in about 2 weeks (around 12/14/2016) for recheck.  These notes generated with voice recognition software. I apologize for typographical  errors.  Zara Council, [facility] [address] [place], [place] [address] [phone]

## 2016-12-04 ENCOUNTER — Telehealth: Payer: Self-pay | Admitting: *Deleted

## 2016-12-04 ENCOUNTER — Encounter: Payer: Managed Care, Other (non HMO) | Admitting: Cardiovascular Disease

## 2016-12-04 NOTE — Telephone Encounter (Signed)
Pt called in an I read message from [facility].

## 2016-12-04 NOTE — Telephone Encounter (Signed)
LMOM for patient to call office back. 

## 2016-12-04 NOTE — Telephone Encounter (Signed)
-----   Message from Nori Riis, PA-C sent at 12/01/2016  6:16 PM EST ----- Please notify the patient that his PSA is normal and he should have annual PSA's and prostate exams.

## 2016-12-14 ENCOUNTER — Other Ambulatory Visit
Admission: RE | Admit: 2016-12-14 | Discharge: 2016-12-14 | Disposition: A | Discharge status: Home / Self Care | Payer: Managed Care, Other (non HMO) | Source: Ambulatory Visit | Attending: Urology | Admitting: Urology

## 2016-12-14 ENCOUNTER — Encounter: Payer: Self-pay | Admitting: Urology

## 2016-12-14 ENCOUNTER — Ambulatory Visit (INDEPENDENT_AMBULATORY_CARE_PROVIDER_SITE_OTHER): Payer: Managed Care, Other (non HMO) | Admitting: Urology

## 2016-12-14 VITALS — BP 132/71 | HR 85 | Ht 73.0 in | Wt 326.0 lb

## 2016-12-14 DIAGNOSIS — N401 Enlarged prostate with lower urinary tract symptoms: Secondary | ICD-10-CM | POA: Diagnosis not present

## 2016-12-14 DIAGNOSIS — N138 Other obstructive and reflux uropathy: Secondary | ICD-10-CM

## 2016-12-14 DIAGNOSIS — R3129 Other microscopic hematuria: Secondary | ICD-10-CM | POA: Insufficient documentation

## 2016-12-14 DIAGNOSIS — Z87448 Personal history of other diseases of urinary system: Secondary | ICD-10-CM | POA: Diagnosis not present

## 2016-12-14 DIAGNOSIS — Z125 Encounter for screening for malignant neoplasm of prostate: Secondary | ICD-10-CM | POA: Diagnosis not present

## 2016-12-14 DIAGNOSIS — N471 Phimosis: Secondary | ICD-10-CM

## 2016-12-14 LAB — URINALYSIS, COMPLETE (UACMP) WITH MICROSCOPIC
BILIRUBIN URINE: NEGATIVE
Bacteria, UA: NONE SEEN
GLUCOSE, UA: NEGATIVE mg/dL
Hgb urine dipstick: NEGATIVE
KETONES UR: NEGATIVE mg/dL
LEUKOCYTES UA: NEGATIVE
NITRITE: NEGATIVE
PH: 7.5 (ref 5.0–8.0)
Protein, ur: NEGATIVE mg/dL
RBC / HPF: NONE SEEN RBC/hpf (ref 0–5)
Specific Gravity, Urine: 1.02 (ref 1.005–1.030)
WBC, UA: NONE SEEN WBC/hpf (ref 0–5)

## 2016-12-14 NOTE — Progress Notes (Signed)
12/14/2016 10:08 AM   Jerry Conley Jun 29, 1966 [id number]  Referring provider: [name], MD [place], [place] [address]  Chief Complaint  Patient presents with  . Follow-up    2 week Phimosis    HPI: 51 yo AAM who presents today for a 2 weeks follow up for phimosis after a trial with Diprolene cream.    Background history Patient is a 51 year old African American male who is referred to [place] by his primary care physician, Dr. Ellison Hughs, for phimosis.  Patient is having difficulty pulling back the foreskin for one year.  He is experiencing some minor pain with intercourse and having trouble with maintaining with erections.  His states his foreskin and the glands are discolored.  His I PSS score today is 10, which is moderate lower urinary tract symptomatology.  He is pleased with his quality life due to his urinary symptoms.  His PVR is 32 mL.  His major complaints today are nocturia and urgency during the night.  He has just started sleeping with a CPAP machine.  He has had these symptoms for a few years.  He did have an episode of gross hematuria 6 months ago.  It occurred intermittently for two days.  He is unsure if it was due to the irritation and bleeding from his foreskin.  He denies any dysuria or suprapubic pain.   He also denies any recent fevers, chills, nausea or vomiting.  His paternal grandfather and father have been diagnosed with prostate cancer.  Prostate cancer was fatal in grandfather.  His father had surgery, but he is unsure if any other treatments were done.  His father is still living.       IPSS    Row Name 11/30/16 0900         International Prostate Symptom Score   How often have you had the sensation of not emptying your bladder? Less than 1 in 5     How often have you had to urinate less than every two hours? Less than half the time     How often have you found you stopped and started again several  times when you urinated? Less than half the time     How often have you found it difficult to postpone urination? Less than half the time     How often have you had a weak urinary stream? Less than 1 in 5 times     How often have you had to strain to start urination? Not at All     How many times did you typically get up at night to urinate? 2 Times     Total IPSS Score 10       Quality of Life due to urinary symptoms   If you were to spend the rest of your life with your urinary condition just the way it is now how would you feel about that? Pleased        Score:  1-7 Mild 8-19 Moderate 20-35 Severe  He has been applying the Diprolene cream bid for the last two weeks.  He has seen great improvement with his foreskin.  He is able to more easily retract the foreskin and intercourse is not painful.  He has not seen any bleeding.     PMH: Past Medical History:  Diagnosis Date  . Hypertension   . Obesity   . Sleep apnea     Surgical History:  Past Surgical History:  Procedure Laterality Date  . THROAT SURGERY      Home Medications:  Allergies as of 12/14/2016   No Known Allergies     Medication List       Accurate as of 12/14/16 10:08 AM. Always use your most recent med list.          betamethasone dipropionate 0.05 % cream Commonly known as:  DIPROLENE Apply topically 2 (two) times daily.   ONE-A-DAY SCOOBY-DOO GUMMIES PO Take by mouth.       Allergies: No Known Allergies  Family History: Family History  Problem Relation Age of Onset  . Prostate cancer Father   . Kidney cancer Neg Hx   . Bladder Cancer Neg Hx     Social History:  reports that he has never smoked. He has never used smokeless tobacco. He reports that he drinks alcohol. He reports that he does not use drugs.  ROS: UROLOGY Frequent Urination?: No Hard to postpone urination?: No Burning/pain with urination?: No Get up at night to urinate?: No Leakage of urine?: No Urine stream starts and  stops?: No Trouble starting stream?: No Do you have to strain to urinate?: No Blood in urine?: No Urinary tract infection?: No Sexually transmitted disease?: No Injury to kidneys or bladder?: No Painful intercourse?: No Weak stream?: No Erection problems?: No Penile pain?: No  Gastrointestinal Nausea?: No Vomiting?: No Indigestion/heartburn?: No Diarrhea?: No Constipation?: No  Constitutional Fever: No Night sweats?: No Weight loss?: No Fatigue?: No  Skin Skin rash/lesions?: No Itching?: No  Eyes Blurred vision?: No Double vision?: No  Ears/Nose/Throat Sore throat?: No Sinus problems?: No  Hematologic/Lymphatic Swollen glands?: No Easy bruising?: No  Cardiovascular Leg swelling?: No Chest pain?: No  Respiratory Cough?: No Shortness of breath?: No  Endocrine Excessive thirst?: No  Musculoskeletal Back pain?: No Joint pain?: No  Neurological Headaches?: No Dizziness?: No  Psychologic Depression?: No Anxiety?: No  Physical Exam: BP 132/71   Pulse 85   Ht 6\' 1"  (1.854 m)   Wt (!) 326 lb (147.9 kg)   BMI 43.01 kg/m   Constitutional: Well nourished. Alert and oriented, No acute distress. HEENT: [place] AT, moist mucus membranes. Trachea midline, no masses. Cardiovascular: No clubbing, cyanosis, or edema. Respiratory: Normal respiratory effort, no increased work of breathing. GI: Abdomen is soft, non tender, non distended, no abdominal masses. Liver and spleen not palpable.  No hernias appreciated.  Stool sample for occult testing is not indicated.   GU: No CVA tenderness.  No bladder fullness or masses.  Patient with uncircumcised phallus. Foreskin is more easily retracted.  Adhesion to the glands on the ventral side.  Vitiligo is present.   Areas of irritation are healed.  Urethral meatus is patent.  No penile discharge. No penile lesions or rashes. Scrotum without lesions, cysts, rashes and/or edema.  Testicles are located scrotally bilaterally. No  masses are appreciated in the testicles. Left and right epididymis are normal. Rectal: Deferred.   Skin: No rashes, bruises or suspicious lesions. Lymph: No cervical or inguinal adenopathy. Neurologic: Grossly intact, no focal deficits, moving all 4 extremities. Psychiatric: Normal mood and affect.  Laboratory Data: PSA History  0.49 ng/mL on 11/30/2016   Lab Results  Component Value Date   WBC 4.9 11/26/2016   HGB 13.2 11/26/2016   HCT 41.0 11/26/2016   MCV 78.8 (L) 11/26/2016   PLT 246 11/26/2016    Lab Results  Component Value Date   CREATININE 0.98 11/26/2016  Lab Results  Component Value Date   TSH 1.227 11/25/2016    Lab Results  Component Value Date   AST 27 11/26/2016   Lab Results  Component Value Date   ALT 31 11/26/2016   Urinalysis Unremarkable.  See EPIC.   Assessment & Plan:    1. Phimosis  - continue betamethasone dipropionate bid  - recheck in one month     2. BPH with LU TS (Not addressed at this visit)  - IPSS score is 10/1  - Continue conservative management, avoiding bladder irritants and timed voiding's  - Most bothersome symptom is nocturia - just started sleeping with CPAP  - RTC in 12 months for IPSS, PSA and exam   - BLADDER SCAN AMB NON-IMAGING  3. Screening PSA  - PSA 0.49  - continue yearly screenings  4. History of hematuria  - Patient had incidences of blood in the urine - unsure if blood was from the irritated foreskin  - Will continue to monitor - UA today no AHM  Return in about 1 month (around 01/14/2017) for exam.  These notes generated with voice recognition software. I apologize for typographical errors.  Zara Council, [facility] [address], [place], [place] [address] [phone]

## 2016-12-20 ENCOUNTER — Ambulatory Visit (INDEPENDENT_AMBULATORY_CARE_PROVIDER_SITE_OTHER): Payer: Managed Care, Other (non HMO) | Admitting: Cardiovascular Disease

## 2016-12-20 ENCOUNTER — Encounter: Payer: Self-pay | Admitting: Cardiovascular Disease

## 2016-12-20 VITALS — BP 122/74 | HR 82 | Ht 73.0 in | Wt 331.0 lb

## 2016-12-20 DIAGNOSIS — I10 Essential (primary) hypertension: Secondary | ICD-10-CM

## 2016-12-20 DIAGNOSIS — R001 Bradycardia, unspecified: Secondary | ICD-10-CM

## 2016-12-20 NOTE — Patient Instructions (Signed)
Medication Instructions: No changes.   Labwork: None.   Procedures/Testing: None.   Follow-Up: As needed with Dr. Arida.   Any Additional Special Instructions Will Be Listed Below (If Applicable).     If you need a refill on your cardiac medications before your next appointment, please call your pharmacy.   

## 2016-12-20 NOTE — Progress Notes (Signed)
Cardiology Office Note   Date:  12/20/2016   ID:  Jerry Conley., DOB 1966-06-28, MRN [id number]  PCP:  [name], MD  Cardiologist:   Kathlyn Sacramento, MD   Chief Complaint  Patient presents with  . other    Hospital follow up. Patient did a sleep study and monitored heart rate. Meds reviewed verbally with patient.      History of Present Illness: Jerry Conley. is a 51 y.o. male who presents for A follow-up visit after recent hospitalization for asymptomatic sinus pause during sleep. The patient has cardiac history. he has known history of obesity and recently diagnosed sleep apnea. he has borderline hypertension currently not on any medications. He recently had a routine sleep study done for suspected sleep apnea. During the sleep study, he was noted to have a 5 second pause. He was thus sent to the hospital where he was observed overnight. Longest pause on telemetry was 2 seconds. His labs were unremarkable including normal thyroid function. He had an echocardiogram done which showed normal LV systolic function with moderate left ventricular hypertrophy and no significant valvular abnormalities. The patient denies any chest pain, shortness of breath, palpitations, dizziness, syncope or presyncope. He is not a smoker and has no family history of coronary artery disease.    Past Medical History:  Diagnosis Date  . Hypertension   . Obesity   . Sleep apnea     Past Surgical History:  Procedure Laterality Date  . THROAT SURGERY       Current Outpatient Prescriptions  Medication Sig Dispense Refill  . betamethasone dipropionate (DIPROLENE) 0.05 % cream Apply topically 2 (two) times daily. 30 g 3  . Pediatric Multivit-Minerals-C (ONE-A-DAY SCOOBY-DOO GUMMIES PO) Take by mouth.     No current facility-administered medications for this visit.     Allergies:   Patient has no known allergies.    Social History:  The patient  reports that he has never smoked.  He has never used smokeless tobacco. He reports that he drinks alcohol. He reports that he does not use drugs.   Family History:  The patient's family history includes Prostate cancer in his father.    ROS:  Please see the history of present illness.   Otherwise, review of systems are positive for none.   All other systems are reviewed and negative.    PHYSICAL EXAM: VS:  BP 122/74 (BP Location: Left Arm, Patient Position: Sitting, Cuff Size: Normal)   Pulse 82   Ht 6\' 1"  (1.854 m)   Wt (!) 331 lb (150.1 kg)   BMI 43.67 kg/m  , BMI Body mass index is 43.67 kg/m. GEN: Well nourished, well developed, in no acute distress  HEENT: normal  Neck: no JVD, carotid bruits, or masses Cardiac: RRR; no murmurs, rubs, or gallops,no edema  Respiratory:  clear to auscultation bilaterally, normal work of breathing GI: soft, nontender, nondistended, + BS MS: no deformity or atrophy  Skin: warm and dry, no rash Neuro:  Strength and sensation are intact Psych: euthymic mood, full affect   EKG:  EKG is ordered today. The ekg ordered today demonstrates normal sinus rhythm with no significant ST or T wave changes.   Recent Labs: 11/25/2016: TSH 1.227 11/26/2016: ALT 31; BUN 14; Creatinine, Ser 0.98; Hemoglobin 13.2; Platelets 246; Potassium 3.7; Sodium 140    Lipid Panel No results found for: CHOL, TRIG, HDL, CHOLHDL, VLDL, LDLCALC, LDLDIRECT    Wt Readings from Last  3 Encounters:  12/20/16 (!) 331 lb (150.1 kg)  12/14/16 (!) 326 lb (147.9 kg)  11/30/16 (!) 326 lb (147.9 kg)       PAD Screen 12/20/2016  Previous PAD dx? No  Previous surgical procedure? No  Pain with walking? No  Feet/toe relief with dangling? No  Painful, non-healing ulcers? No  Extremities discolored? No      ASSESSMENT AND PLAN:  1.  Asymptomatic bradycardia: I suspect that he had vagally mediated sinus pause due to underlying sleep apnea. He currently has no symptoms related to this and there is no evidence  that he is having this while awake. His EKG is completely normal and there is no evidence of bradycardia or AV block. Echocardiogram was overall unremarkable. I recommend treatment of underlying sleep apnea. No further cardiac workup is indicated.  2. Obesity: I discussed with him the importance of diet and exercise.   Disposition:   FU with me as needed.   Signed,  Kathlyn Sacramento, MD  12/20/2016 2:44 PM    [place]

## 2017-01-16 NOTE — Progress Notes (Signed)
01/18/2017 10:27 AM   Jerry Conley 02-11-1966 [id number]  Referring provider: [name], MD Jerry Conley, Jerry Conley [id number]  Chief Complaint  Patient presents with  . Follow-up    1 month FU  Phimosis    HPI: 51 yo AAM who presents today for a one month follow up for phimosis after a trial with Diprolene cream.    Background history Patient is a 51 year old African American male who is referred to Jerry Conley by his primary care physician, Jerry Conley, for phimosis.  Patient is having difficulty pulling back the foreskin for one year.  He is experiencing some minor pain with intercourse and having trouble with maintaining with erections.  His states his foreskin and the glands are discolored.  His I PSS score today is 10, which is moderate lower urinary tract symptomatology.  He is pleased with his quality life due to his urinary symptoms.  His PVR is 32 mL.  His major complaints today are nocturia and urgency during the night.  He has just started sleeping with a CPAP machine.  He has had these symptoms for a few years.  He did have an episode of gross hematuria 6 months ago.  It occurred intermittently for two days.  He is unsure if it was due to the irritation and bleeding from his foreskin.  He denies any dysuria or suprapubic pain.   He also denies any recent fevers, chills, nausea or vomiting.  His paternal grandfather and father have been diagnosed with prostate cancer.  Prostate cancer was fatal in grandfather.  His father had surgery, but he is unsure if any other treatments were done.  His father is still living.       IPSS    Row Name 11/30/16 0900         International Prostate Symptom Score   How often have you had the sensation of not emptying your bladder? Less than 1 in 5     How often have you had to urinate less than every two hours? Less than half the time     How often have you found you stopped and started  again several times when you urinated? Less than half the time     How often have you found it difficult to postpone urination? Less than half the time     How often have you had a weak urinary stream? Less than 1 in 5 times     How often have you had to strain to start urination? Not at All     How many times did you typically get up at night to urinate? 2 Times     Total IPSS Score 10       Quality of Life due to urinary symptoms   If you were to spend the rest of your life with your urinary condition just the way it is now how would you feel about that? Pleased        Score:  1-7 Mild 8-19 Moderate 20-35 Severe  He has been applying the Diprolene cream bid for the two months.  He has continued to see improvement with his foreskin, but he feels like it has plateau.  He is able to more easily retract the foreskin and intercourse is not painful.  He has not seen any bleeding.  He is not wanting to pursue circumcision at this time.     PMH: Past Medical History:  Diagnosis Date  . Hypertension   . Obesity   . Sleep apnea     Surgical History: Past Surgical History:  Procedure Laterality Date  . THROAT SURGERY      Home Medications:  Allergies as of 01/18/2017   No Known Allergies     Medication List       Accurate as of 01/18/17 10:27 AM. Always use your most recent med list.          betamethasone dipropionate 0.05 % cream Commonly known as:  DIPROLENE Apply topically 2 (two) times daily.   ONE-A-DAY SCOOBY-DOO GUMMIES PO Take by mouth.       Allergies: No Known Allergies  Family History: Family History  Problem Relation Age of Onset  . Prostate cancer Father   . Kidney cancer Neg Hx   . Bladder Cancer Neg Hx     Social History:  reports that he has never smoked. He has never used smokeless tobacco. He reports that he drinks alcohol. He reports that he does not use drugs.  ROS: UROLOGY Frequent Urination?: No Hard to postpone urination?:  No Burning/pain with urination?: No Get up at night to urinate?: No Leakage of urine?: No Urine stream starts and stops?: No Trouble starting stream?: No Do you have to strain to urinate?: No Blood in urine?: No Urinary tract infection?: No Sexually transmitted disease?: No Injury to kidneys or bladder?: No Painful intercourse?: No Weak stream?: No Erection problems?: No Penile pain?: No  Gastrointestinal Nausea?: No Vomiting?: No Indigestion/heartburn?: No Diarrhea?: No Constipation?: No  Constitutional Fever: No Night sweats?: No Weight loss?: No Fatigue?: No  Skin Skin rash/lesions?: No Itching?: No  Eyes Blurred vision?: No Double vision?: No  Ears/Nose/Throat Sore throat?: No Sinus problems?: No  Hematologic/Lymphatic Swollen glands?: No Easy bruising?: No  Cardiovascular Leg swelling?: No Chest pain?: No  Respiratory Cough?: No Shortness of breath?: No  Endocrine Excessive thirst?: No  Musculoskeletal Back pain?: No Joint pain?: No  Neurological Headaches?: No Dizziness?: No  Psychologic Depression?: No Anxiety?: No  Physical Exam: BP 136/76   Pulse 74   Ht 6\' 1"  (1.854 m)   Wt (!) 330 lb (149.7 kg)   BMI 43.54 kg/m   Constitutional: Well nourished. Alert and oriented, No acute distress. HEENT: [place] AT, moist mucus membranes. Trachea midline, no masses. Cardiovascular: No clubbing, cyanosis, or edema. Respiratory: Normal respiratory effort, no increased work of breathing. GI: Abdomen is soft, non tender, non distended, no abdominal masses. Liver and spleen not palpable.  No hernias appreciated.  Stool sample for occult testing is not indicated.   GU: No CVA tenderness.  No bladder fullness or masses.  Patient with uncircumcised phallus. Foreskin is easily retracted.  Adhesion to the glands on the ventral side.  Vitiligo is present.   Areas of irritation are healed.  Urethral meatus is patent.  No penile discharge. No penile lesions  or rashes. Scrotum without lesions, cysts, rashes and/or edema.  Testicles are located scrotally bilaterally. No masses are appreciated in the testicles. Left and right epididymis are normal. Rectal: Deferred.   Skin: No rashes, bruises or suspicious lesions. Lymph: No cervical or inguinal adenopathy. Neurologic: Grossly intact, no focal deficits, moving all 4 extremities. Psychiatric: Normal mood and affect.  Laboratory Data: PSA History  0.49 ng/mL on 11/30/2016   Lab Results  Component Value Date   WBC 4.9 11/26/2016   HGB 13.2 11/26/2016   HCT 41.0 11/26/2016   MCV 78.8 (L) 11/26/2016  PLT 246 11/26/2016    Lab Results  Component Value Date   CREATININE 0.98 11/26/2016     Lab Results  Component Value Date   TSH 1.227 11/25/2016    Lab Results  Component Value Date   AST 27 11/26/2016   Lab Results  Component Value Date   ALT 31 11/26/2016     Assessment & Plan:    1. Phimosis  - continue betamethasone dipropionate bid  - recheck in 12 months    2. BPH with LU TS (Not addressed at this visit)  - IPSS score is 10/1  - Continue conservative management, avoiding bladder irritants and timed voiding's  - Most bothersome symptom is nocturia - just started sleeping with CPAP  - RTC in 12 months for IPSS, PSA and exam   - BLADDER SCAN AMB NON-IMAGING  3. Screening PSA (Not addressed at this visit)  - PSA 0.49  - continue yearly screenings  4. History of hematuria (Not addressed at this visit)  - Patient had incidences of blood in the urine - unsure if blood was from the irritated foreskin  - Will continue to monitor - UA today no AHM  Return in about 1 year (around 01/18/2018) for IPSS, PSA and exam.  These notes generated with voice recognition software. I apologize for typographical errors.  Zara Council, [facility] [address] [place], [place] [address] [phone]

## 2017-01-18 ENCOUNTER — Encounter: Payer: Self-pay | Admitting: Urology

## 2017-01-18 ENCOUNTER — Other Ambulatory Visit: Payer: Self-pay | Admitting: *Deleted

## 2017-01-18 ENCOUNTER — Ambulatory Visit (INDEPENDENT_AMBULATORY_CARE_PROVIDER_SITE_OTHER): Payer: Managed Care, Other (non HMO) | Admitting: Urology

## 2017-01-18 VITALS — BP 136/76 | HR 74 | Ht 73.0 in | Wt 330.0 lb

## 2017-01-18 DIAGNOSIS — N471 Phimosis: Secondary | ICD-10-CM

## 2017-01-18 DIAGNOSIS — N400 Enlarged prostate without lower urinary tract symptoms: Secondary | ICD-10-CM

## 2017-01-18 MED ORDER — BETAMETHASONE DIPROPIONATE 0.05 % EX CREA: TOPICAL_CREAM | Freq: Two times a day (BID) | CUTANEOUS | 11 refills | Status: DC

## 2018-01-10 ENCOUNTER — Other Ambulatory Visit
Admission: RE | Admit: 2018-01-10 | Discharge: 2018-01-10 | Disposition: A | Discharge status: Home / Self Care | Payer: BLUE CROSS/BLUE SHIELD | Source: Ambulatory Visit | Attending: Urology | Admitting: Urology

## 2018-01-10 DIAGNOSIS — N400 Enlarged prostate without lower urinary tract symptoms: Secondary | ICD-10-CM

## 2018-01-10 LAB — PSA: Prostatic Specific Antigen: 0.45 ng/mL (ref 0.00–4.00)

## 2018-01-16 NOTE — Progress Notes (Signed)
01/17/2018 9:27 AM   Jerry Conley 1966/09/04 [id number]  Referring provider: [name], MD [name], [place] [address]  Chief Complaint  Patient presents with  . Benign Prostatic Hypertrophy  . Follow-up    phimosis    HPI: 52 yo AAM with phimosis, BPH with LU TS and a history of hematuria who presents today for a follow up.    Background history Patient is a 52 year old African American male who is referred to [place] by his primary care physician, Dr. Ellison Hughs, for phimosis.  Patient is having difficulty pulling back the foreskin for one year.  He is experiencing some minor pain with intercourse and having trouble with maintaining with erections.  His states his foreskin and the glands are discolored.  His I PSS score today is 10, which is moderate lower urinary tract symptomatology.  He is pleased with his quality life due to his urinary symptoms.  His PVR is 32 mL.  His major complaints today are nocturia and urgency during the night.  He has just started sleeping with a CPAP machine.  He has had these symptoms for a few years.  He did have an episode of gross hematuria 6 months ago.  It occurred intermittently for two days.  He is unsure if it was due to the irritation and bleeding from his foreskin.  He denies any dysuria or suprapubic pain.   He also denies any recent fevers, chills, nausea or vomiting.  His paternal grandfather and father have been diagnosed with prostate cancer.  Prostate cancer was fatal in grandfather.  His father had surgery, but he is unsure if any other treatments were done.  His father is still living.    BPH WITH LUTS  (prostate and/or bladder) IPSS score: 4/1       Previous score: 10/1  Previous PVR: 32  Denies any dysuria, hematuria or suprapubic pain.   Denies any recent fevers, chills, nausea or vomiting.  His father and paternal grandfather have been diagnosed with prostate cancer.    IPSS    Row Name 01/17/18 0900           International Prostate Symptom Score   How often have you had the sensation of not emptying your bladder?  Not at All     How often have you had to urinate less than every two hours?  Less than 1 in 5 times     How often have you found you stopped and started again several times when you urinated?  Not at All     How often have you found it difficult to postpone urination?  Less than 1 in 5 times     How often have you had a weak urinary stream?  Less than 1 in 5 times     How often have you had to strain to start urination?  Not at All     How many times did you typically get up at night to urinate?  1 Time     Total IPSS Score  4       Quality of Life due to urinary symptoms   If you were to spend the rest of your life with your urinary condition just the way it is now how would you feel about that?  Pleased        Score:  1-7 Mild 8-19 Moderate 20-35 Severe   History of hematuria He did have an episode of gross hematuria 6  months ago.  It occurred intermittently for two days.  He is unsure if it was due to the irritation and bleeding from his foreskin.  Follow up UA in 12/2016 was negative for hematuria.  He does not endorse any further gross hematuria.  His UA today is positive for 0-5 RBC's.    Phimosis He has been applying the Diprolene cream.  He has continued to see improvement with his foreskin.  He is able to more easily retract the foreskin and intercourse is not painful.  He has not seen any bleeding.  He is not wanting to pursue circumcision at this time.     PMH: Past Medical History:  Diagnosis Date  . Hypertension   . Obesity   . Sleep apnea     Surgical History: Past Surgical History:  Procedure Laterality Date  . THROAT SURGERY      Home Medications:  Allergies as of 01/17/2018   No Known Allergies     Medication List        Accurate as of 01/17/18  9:27 AM. Always use your most recent med list.          betamethasone dipropionate 0.05 %  cream Commonly known as:  DIPROLENE Apply topically 2 (two) times daily.   ONE-A-DAY SCOOBY-DOO GUMMIES PO Take by mouth.       Allergies: No Known Allergies  Family History: Family History  Problem Relation Age of Onset  . Prostate cancer Father   . Kidney cancer Neg Hx   . Bladder Cancer Neg Hx     Social History:  reports that he has never smoked. He has never used smokeless tobacco. He reports that he drinks alcohol. He reports that he does not use drugs.  ROS: UROLOGY Frequent Urination?: No Hard to postpone urination?: No Burning/pain with urination?: No Get up at night to urinate?: No Leakage of urine?: No Urine stream starts and stops?: No Trouble starting stream?: No Do you have to strain to urinate?: No Blood in urine?: No Urinary tract infection?: No Sexually transmitted disease?: No Injury to kidneys or bladder?: No Painful intercourse?: No Weak stream?: No Erection problems?: No Penile pain?: No  Gastrointestinal Nausea?: No Vomiting?: No Indigestion/heartburn?: No Diarrhea?: No Constipation?: No  Constitutional Fever: No Night sweats?: No Weight loss?: No Fatigue?: No  Skin Skin rash/lesions?: No Itching?: No  Eyes Blurred vision?: No Double vision?: No  Ears/Nose/Throat Sore throat?: No Sinus problems?: No  Hematologic/Lymphatic Swollen glands?: No Easy bruising?: No  Cardiovascular Leg swelling?: No Chest pain?: No  Respiratory Cough?: No Shortness of breath?: No  Endocrine Excessive thirst?: No  Musculoskeletal Back pain?: No Joint pain?: No  Neurological Headaches?: No Dizziness?: No  Psychologic Depression?: No Anxiety?: No  Physical Exam: BP 125/77   Pulse 76   Ht 6\' 1"  (1.854 m)   Wt (!) 330 lb (149.7 kg)   BMI 43.54 kg/m   Constitutional: Well nourished. Alert and oriented, No acute distress. HEENT: [place] AT, moist mucus membranes. Trachea midline, no masses. Cardiovascular: No clubbing,  cyanosis, or edema. Respiratory: Normal respiratory effort, no increased work of breathing. GI: Abdomen is soft, non tender, non distended, no abdominal masses. Liver and spleen not palpable.  No hernias appreciated.  Stool sample for occult testing is not indicated.   GU: No CVA tenderness.  No bladder fullness or masses.  Patient with uncircumcised phallus.   Foreskin is easily retracted.   Vitiligo is present.  Urethral meatus is patent.  No penile  discharge. No penile lesions or rashes. Scrotum without lesions, cysts, rashes and/or edema.  Testicles are located scrotally bilaterally. No masses are appreciated in the testicles. Left and right epididymis are normal. Rectal: Patient with  normal sphincter tone. Anus and perineum without scarring or rashes. No rectal masses are appreciated. Prostate DRE limited to patient's body habitus,  Approximately 50 grams, no nodules are appreciated. Seminal vesicles are normal. Skin: No rashes, bruises or suspicious lesions. Lymph: No cervical or inguinal adenopathy. Neurologic: Grossly intact, no focal deficits, moving all 4 extremities. Psychiatric: Normal mood and affect.   Laboratory Data: PSA History  0.49 ng/mL on 11/30/2016  0.45 ng/mL on 01/10/2018   Lab Results  Component Value Date   WBC 4.9 11/26/2016   HGB 13.2 11/26/2016   HCT 41.0 11/26/2016   MCV 78.8 (L) 11/26/2016   PLT 246 11/26/2016    Lab Results  Component Value Date   CREATININE 0.98 11/26/2016     Lab Results  Component Value Date   TSH 1.227 11/25/2016    Lab Results  Component Value Date   AST 27 11/26/2016   Lab Results  Component Value Date   ALT 31 11/26/2016   I have reviewed the labs.  Assessment & Plan:    1. Phimosis  - continue betamethasone dipropionate bid  - recheck in 12 months    2. BPH with LU TS   - IPSS score is 4/1, it is improving  - Continue conservative management, avoiding bladder irritants and timed voiding's  - Still  sleeping with CPAP  - RTC in 12 months for IPSS, PSA and exam   3. Screening PSA   - PSA drawn today  - continue yearly screenings - of PSA remains stable  4. History of hematuria   - Patient had incidences of blood in the urine   - Will continue to monitor   - UA today is positive for 0-5 RBC's.  Will send for culture.  If culture is negative will pursue hematuria work up.  If positive, will treat and then recheck urine.    Return in about 1 year (around 01/18/2019) for IPSS, PSA and exam.  These notes generated with voice recognition software. I apologize for typographical errors.  Zara Council, PA-C  [facility] [address] [place], [place] [address] [phone]

## 2018-01-17 ENCOUNTER — Encounter: Payer: Self-pay | Admitting: Urology

## 2018-01-17 ENCOUNTER — Ambulatory Visit (INDEPENDENT_AMBULATORY_CARE_PROVIDER_SITE_OTHER): Payer: BLUE CROSS/BLUE SHIELD | Admitting: Urology

## 2018-01-17 ENCOUNTER — Other Ambulatory Visit
Admission: RE | Admit: 2018-01-17 | Discharge: 2018-01-17 | Disposition: A | Discharge status: Home / Self Care | Payer: BLUE CROSS/BLUE SHIELD | Source: Ambulatory Visit | Attending: Urology | Admitting: Urology

## 2018-01-17 ENCOUNTER — Telehealth: Payer: Self-pay

## 2018-01-17 ENCOUNTER — Other Ambulatory Visit: Payer: Self-pay

## 2018-01-17 VITALS — BP 125/77 | HR 76 | Ht 73.0 in | Wt 330.0 lb

## 2018-01-17 DIAGNOSIS — N471 Phimosis: Secondary | ICD-10-CM

## 2018-01-17 DIAGNOSIS — Z87448 Personal history of other diseases of urinary system: Secondary | ICD-10-CM | POA: Diagnosis not present

## 2018-01-17 DIAGNOSIS — N400 Enlarged prostate without lower urinary tract symptoms: Secondary | ICD-10-CM | POA: Diagnosis not present

## 2018-01-17 DIAGNOSIS — N138 Other obstructive and reflux uropathy: Secondary | ICD-10-CM | POA: Insufficient documentation

## 2018-01-17 DIAGNOSIS — Z125 Encounter for screening for malignant neoplasm of prostate: Secondary | ICD-10-CM | POA: Diagnosis not present

## 2018-01-17 DIAGNOSIS — N401 Enlarged prostate with lower urinary tract symptoms: Principal | ICD-10-CM

## 2018-01-17 LAB — URINALYSIS, COMPLETE (UACMP) WITH MICROSCOPIC
BILIRUBIN URINE: NEGATIVE
Bacteria, UA: NONE SEEN
GLUCOSE, UA: NEGATIVE mg/dL
HGB URINE DIPSTICK: NEGATIVE
Ketones, ur: NEGATIVE mg/dL
Leukocytes, UA: NEGATIVE
NITRITE: NEGATIVE
PROTEIN: NEGATIVE mg/dL
Specific Gravity, Urine: 1.015 (ref 1.005–1.030)
pH: 7.5 (ref 5.0–8.0)

## 2018-01-17 NOTE — Telephone Encounter (Signed)
-----   Message from Nori Riis, PA-C sent at 01/17/2018 10:44 AM EDT ----- Spoke with patient regarding his UA results.  We will need to send it for culture.  If the culture returns negative, we will need to pursue a CTU and cystoscopy.  I have explained the procedures to him and the risks involved.  If the culture returns positive, we will need to treat and recheck the urine.

## 2018-01-18 LAB — URINE CULTURE: Culture: NO GROWTH

## 2018-01-20 ENCOUNTER — Other Ambulatory Visit: Payer: Self-pay | Admitting: Urology

## 2018-01-20 DIAGNOSIS — R3129 Other microscopic hematuria: Secondary | ICD-10-CM

## 2018-01-20 NOTE — Progress Notes (Signed)
CTU orders are in chart.

## 2018-01-21 ENCOUNTER — Telehealth: Payer: Self-pay

## 2018-01-21 NOTE — Telephone Encounter (Signed)
App made and patient is aware ° °Jerry Conley °

## 2018-01-21 NOTE — Telephone Encounter (Signed)
Spoke with pt in reference to -ucx and needing CTU and cysto. Pt voiced understanding.  Pt requested cysto in [place]. Can you schedule that?

## 2018-01-21 NOTE — Telephone Encounter (Signed)
-----   Message from Nori Riis, PA-C sent at 01/20/2018  9:35 AM EDT ----- Please let Mr. Wiechmann know that his urine culture is negative.  We need to have him scheduled for a CTU and cystoscopy.  I have put the orders in for the CTU, he will need to be scheduled for a cystoscopy.

## 2018-02-03 ENCOUNTER — Ambulatory Visit
Admission: RE | Admit: 2018-02-03 | Discharge: 2018-02-03 | Disposition: A | Discharge status: Home / Self Care | Payer: BLUE CROSS/BLUE SHIELD | Source: Ambulatory Visit | Attending: Urology | Admitting: Urology

## 2018-02-03 DIAGNOSIS — K449 Diaphragmatic hernia without obstruction or gangrene: Secondary | ICD-10-CM | POA: Diagnosis not present

## 2018-02-03 DIAGNOSIS — R3129 Other microscopic hematuria: Secondary | ICD-10-CM | POA: Diagnosis present

## 2018-02-03 DIAGNOSIS — K769 Liver disease, unspecified: Secondary | ICD-10-CM | POA: Diagnosis not present

## 2018-02-03 MED ORDER — IOPAMIDOL (ISOVUE-300) INJECTION 61%
150.0000 mL | Freq: Once | INTRAVENOUS | Status: AC | PRN
  Administered 2018-02-03: 125 mL via INTRAVENOUS

## 2018-02-27 ENCOUNTER — Other Ambulatory Visit: Payer: Self-pay

## 2018-02-27 DIAGNOSIS — R3129 Other microscopic hematuria: Secondary | ICD-10-CM

## 2018-02-28 ENCOUNTER — Other Ambulatory Visit
Admission: RE | Admit: 2018-02-28 | Discharge: 2018-02-28 | Disposition: A | Discharge status: Home / Self Care | Payer: BLUE CROSS/BLUE SHIELD | Source: Ambulatory Visit | Attending: Urology | Admitting: Urology

## 2018-02-28 ENCOUNTER — Encounter: Payer: Self-pay | Admitting: Urology

## 2018-02-28 ENCOUNTER — Ambulatory Visit (INDEPENDENT_AMBULATORY_CARE_PROVIDER_SITE_OTHER): Payer: BLUE CROSS/BLUE SHIELD | Admitting: Urology

## 2018-02-28 VITALS — BP 183/80 | HR 87 | Resp 16 | Ht 73.0 in | Wt 330.0 lb

## 2018-02-28 DIAGNOSIS — R3129 Other microscopic hematuria: Secondary | ICD-10-CM | POA: Diagnosis present

## 2018-02-28 DIAGNOSIS — R310 Gross hematuria: Secondary | ICD-10-CM | POA: Diagnosis not present

## 2018-02-28 DIAGNOSIS — K769 Liver disease, unspecified: Secondary | ICD-10-CM

## 2018-02-28 LAB — URINALYSIS, COMPLETE (UACMP) WITH MICROSCOPIC
BILIRUBIN URINE: NEGATIVE
Bacteria, UA: NONE SEEN
Glucose, UA: NEGATIVE mg/dL
LEUKOCYTES UA: NEGATIVE
NITRITE: NEGATIVE
Protein, ur: NEGATIVE mg/dL
Specific Gravity, Urine: 1.02 (ref 1.005–1.030)
pH: 5.5 (ref 5.0–8.0)

## 2018-02-28 MED ORDER — CIPROFLOXACIN HCL 500 MG PO TABS
500.0000 mg | ORAL_TABLET | Freq: Once | ORAL | Status: AC
  Administered 2018-02-28: 500 mg via ORAL

## 2018-02-28 MED ORDER — LIDOCAINE HCL URETHRAL/MUCOSAL 2 % EX GEL
1.0000 "application " | Freq: Once | CUTANEOUS | Status: AC
  Administered 2018-02-28: 1 via URETHRAL

## 2018-02-28 NOTE — Progress Notes (Signed)
   03/04/18  CC:  Chief Complaint  Patient presents with  . Cysto    HPI: 52 year old male with episode of hematuria who presents today for cystoscopy to complete his work-up.  He underwent CT urogram on 02/03/2017 which was negative for any GU pathology.  This was personally reviewed today.  Blood pressure (!) 183/80, pulse 87, resp. rate 16, height 6\' 1"  (1.854 m), weight (!) 330 lb (149.7 kg), SpO2 96 %. NED. A&Ox3.   No respiratory distress   Abd soft, NT, ND Normal phallus with bilateral descended testicles  Cystoscopy Procedure Note  Patient identification was confirmed, informed consent was obtained, and patient was prepped using Betadine solution.  Lidocaine jelly was administered per urethral meatus.    Preoperative abx where received prior to procedure.     Pre-Procedure: - Inspection reveals a normal caliber ureteral meatus.  Procedure: The flexible cystoscope was introduced without difficulty - No urethral strictures/lesions are present. - Mildly enlarged prostate with bilobar coaptation, friable prostatic vessels appreciated - Normal bladder neck - Bilateral ureteral orifices identified - Bladder mucosa  reveals no ulcers, tumors, or lesions - No bladder stones - No trabeculation  Retroflexion shows minimal intravesical protrusion with filled ectatic vessels of the surface   Post-Procedure: - Patient tolerated the procedure well  Assessment/ Plan:  1. Gross hematuria No clear etiology for episode of gross hematuria other than mild prostamegaly with somewhat friable prostatic vessels No other bladder pathology identified CT urogram results reviewed with patient - ciprofloxacin (CIPRO) tablet 500 mg - lidocaine (XYLOCAINE) 2 % jelly 1 application  2. Liver lesion Incidental multiple liver lesions, favored to represent benign cysts No personal history of malignancy or liver disease Findings discussed with patient, no indication for further  evaluation  Follow-up with Zara Council for routine annual urologic visit as previously scheduled  Hollice Espy, MD

## 2018-03-05 ENCOUNTER — Other Ambulatory Visit: Payer: Self-pay | Admitting: Urology

## 2018-06-04 ENCOUNTER — Ambulatory Visit: Payer: BLUE CROSS/BLUE SHIELD | Admitting: Anesthesiology

## 2018-06-04 ENCOUNTER — Ambulatory Visit
Admission: RE | Admit: 2018-06-04 | Discharge: 2018-06-04 | Disposition: A | Discharge status: Home / Self Care | Payer: BLUE CROSS/BLUE SHIELD | Source: Ambulatory Visit | Attending: Unknown Physician Specialty | Admitting: Unknown Physician Specialty

## 2018-06-04 ENCOUNTER — Encounter
Admission: RE | Disposition: A | Discharge status: Home / Self Care | Payer: Self-pay | Source: Ambulatory Visit | Attending: Unknown Physician Specialty

## 2018-06-04 ENCOUNTER — Encounter: Payer: Self-pay | Admitting: *Deleted

## 2018-06-04 DIAGNOSIS — D123 Benign neoplasm of transverse colon: Secondary | ICD-10-CM | POA: Diagnosis not present

## 2018-06-04 DIAGNOSIS — K640 First degree hemorrhoids: Secondary | ICD-10-CM | POA: Insufficient documentation

## 2018-06-04 DIAGNOSIS — E669 Obesity, unspecified: Secondary | ICD-10-CM | POA: Insufficient documentation

## 2018-06-04 DIAGNOSIS — Z6841 Body Mass Index (BMI) 40.0 and over, adult: Secondary | ICD-10-CM | POA: Diagnosis not present

## 2018-06-04 DIAGNOSIS — I10 Essential (primary) hypertension: Secondary | ICD-10-CM | POA: Insufficient documentation

## 2018-06-04 DIAGNOSIS — Z1211 Encounter for screening for malignant neoplasm of colon: Secondary | ICD-10-CM | POA: Insufficient documentation

## 2018-06-04 DIAGNOSIS — Z9989 Dependence on other enabling machines and devices: Secondary | ICD-10-CM | POA: Insufficient documentation

## 2018-06-04 DIAGNOSIS — G4730 Sleep apnea, unspecified: Secondary | ICD-10-CM | POA: Insufficient documentation

## 2018-06-04 HISTORY — PX: COLONOSCOPY WITH PROPOFOL: SHX5780

## 2018-06-04 SURGERY — COLONOSCOPY WITH PROPOFOL
Anesthesia: General

## 2018-06-04 MED ORDER — PROPOFOL 500 MG/50ML IV EMUL
INTRAVENOUS | Status: DC | PRN
  Administered 2018-06-04: 150 ug/kg/min via INTRAVENOUS

## 2018-06-04 MED ORDER — LIDOCAINE HCL (PF) 2 % IJ SOLN
INTRAMUSCULAR | Status: AC
  Filled 2018-06-04: qty 10

## 2018-06-04 MED ORDER — LIDOCAINE HCL (CARDIAC) PF 100 MG/5ML IV SOSY
PREFILLED_SYRINGE | INTRAVENOUS | Status: DC | PRN
  Administered 2018-06-04: 50 mg via INTRAVENOUS

## 2018-06-04 MED ORDER — PROPOFOL 10 MG/ML IV BOLUS
INTRAVENOUS | Status: AC
  Filled 2018-06-04: qty 20

## 2018-06-04 MED ORDER — PROPOFOL 10 MG/ML IV BOLUS
INTRAVENOUS | Status: DC | PRN
  Administered 2018-06-04 (×2): 50 mg via INTRAVENOUS
  Administered 2018-06-04: 100 mg via INTRAVENOUS

## 2018-06-04 MED ORDER — SODIUM CHLORIDE 0.9 % IV SOLN: INTRAVENOUS | Status: DC

## 2018-06-04 MED ORDER — SODIUM CHLORIDE 0.9 % IV SOLN
INTRAVENOUS | Status: DC
  Administered 2018-06-04: 12:00:00 via INTRAVENOUS

## 2018-06-04 MED ORDER — PROPOFOL 500 MG/50ML IV EMUL
INTRAVENOUS | Status: AC
  Filled 2018-06-04: qty 50

## 2018-06-04 NOTE — Op Note (Signed)
[facility] Patient Name: Jerry Conley Procedure Date: 06/04/2018 11:29 AM MRN: [id number] Account #: [id number] Date of Birth: Feb 16, 1966 Admit Type: Outpatient Age: 52 Room: [facility] ENDO ROOM 3 Gender: Male Note Status: Finalized Procedure:            Colonoscopy Indications:          Screening for colorectal malignant neoplasm Providers:            Manya Silvas, MD Referring MD:         [name] (Referring MD) Medicines:            Propofol per Anesthesia Complications:        No immediate complications. Procedure:            Pre-Anesthesia Assessment:                       - After reviewing the risks and benefits, the patient                        was deemed in satisfactory condition to undergo the                        procedure.                       After obtaining informed consent, the colonoscope was                        passed under direct vision. Throughout the procedure,                        the patient's blood pressure, pulse, and oxygen                        saturations were monitored continuously. The                        Colonoscope was introduced through the anus and                        advanced to the the cecum, identified by appendiceal                        orifice and ileocecal valve. The colonoscopy was                        performed without difficulty. The patient tolerated the                        procedure well. The quality of the bowel preparation                        was good. Findings:      A diminutive polyp was found in the transverse colon. The polyp was       sessile. The polyp was removed with a jumbo cold forceps. Resection and       retrieval were complete.      Internal hemorrhoids were found during endoscopy. The hemorrhoids were       small and Grade I (internal hemorrhoids that do not prolapse). A lot of  washing of the wall of the colon was done to visualize the mucosa. No        other abnormality seen. Impression:           - One diminutive polyp in the transverse colon, removed                        with a jumbo cold forceps. Resected and retrieved.                       - Internal hemorrhoids. Recommendation:       - Await pathology results. Manya Silvas, MD 06/04/2018 12:34:22 PM This report has been signed electronically. Number of Addenda: 0 Note Initiated On: 06/04/2018 11:29 AM Scope Withdrawal Time: 0 hours 16 minutes 19 seconds  Total Procedure Duration: 0 hours 23 minutes 30 seconds       [facility]

## 2018-06-04 NOTE — Anesthesia Post-op Follow-up Note (Signed)
Anesthesia QCDR form completed.        

## 2018-06-04 NOTE — Anesthesia Preprocedure Evaluation (Signed)
Anesthesia Evaluation  Patient identified by MRN, date of birth, ID band Patient awake    Reviewed: Allergy & Precautions, H&P , NPO status , Patient's Chart, lab work & pertinent test results  Airway Mallampati: III  TM Distance: >3 FB Neck ROM: full    Dental  (+) Teeth Intact   Pulmonary neg pulmonary ROS, sleep apnea and Continuous Positive Airway Pressure Ventilation ,    breath sounds clear to auscultation       Cardiovascular hypertension, negative cardio ROS   Rhythm:regular Rate:Normal     Neuro/Psych negative neurological ROS  negative psych ROS   GI/Hepatic negative GI ROS, Neg liver ROS,   Endo/Other  negative endocrine ROS  Renal/GU negative Renal ROS  negative genitourinary   Musculoskeletal   Abdominal   Peds  Hematology negative hematology ROS (+)   Anesthesia Other Findings Past Medical History: No date: Hypertension No date: Obesity No date: Sleep apnea  Past Surgical History: No date: THROAT SURGERY  BMI    Body Mass Index:  43.54 kg/m      Reproductive/Obstetrics negative OB ROS                             Anesthesia Physical Anesthesia Plan  ASA: III  Anesthesia Plan: General   Post-op Pain Management:    Induction:   PONV Risk Score and Plan: Propofol infusion  Airway Management Planned: Natural Airway and Nasal Cannula  Additional Equipment:   Intra-op Plan:   Post-operative Plan:   Informed Consent: I have reviewed the patients History and Physical, chart, labs and discussed the procedure including the risks, benefits and alternatives for the proposed anesthesia with the patient or authorized representative who has indicated his/her understanding and acceptance.   Dental Advisory Given  Plan Discussed with: Anesthesiologist, CRNA and Surgeon  Anesthesia Plan Comments:         Anesthesia Quick Evaluation

## 2018-06-04 NOTE — Anesthesia Postprocedure Evaluation (Signed)
Anesthesia Post Note  Patient: Jerry Conley.  Procedure(s) Performed: COLONOSCOPY WITH PROPOFOL (N/A )  Patient location during evaluation: PACU Anesthesia Type: General Level of consciousness: awake and alert Pain management: pain level controlled Vital Signs Assessment: post-procedure vital signs reviewed and stable Respiratory status: spontaneous breathing, nonlabored ventilation and respiratory function stable Cardiovascular status: blood pressure returned to baseline and stable Postop Assessment: no apparent nausea or vomiting Anesthetic complications: no     Last Vitals:  Vitals:   06/04/18 1257 06/04/18 1307  BP: 114/69 106/66  Pulse:    Resp:    Temp:    SpO2:      Last Pain:  Vitals:   06/04/18 1307  TempSrc:   PainSc: 0-No pain                 Durenda Hurt

## 2018-06-04 NOTE — Transfer of Care (Signed)
Immediate Anesthesia Transfer of Care Note  Patient: Jerry Conley.  Procedure(s) Performed: COLONOSCOPY WITH PROPOFOL (N/A )  Patient Location: PACU and Endoscopy Unit  Anesthesia Type:General  Level of Consciousness: awake  Airway & Oxygen Therapy: Patient connected to nasal cannula oxygen  Post-op Assessment: Report given to RN  Post vital signs: stable  Last Vitals:  Vitals Value Taken Time  BP 111/63 06/04/2018 12:34 PM  Temp 36.7 C 06/04/2018 12:34 PM  Pulse 78 06/04/2018 12:36 PM  Resp 18 06/04/2018 12:36 PM  SpO2 100 % 06/04/2018 12:36 PM  Vitals shown include unvalidated device data.  Last Pain:  Vitals:   06/04/18 1234  TempSrc: Tympanic  PainSc:          Complications: No apparent anesthesia complications

## 2018-06-04 NOTE — H&P (Signed)
Primary Care Physician:  [name], MD Primary Gastroenterologist:  Dr. Vira Agar  Pre-Procedure History & Physical: HPI:  Jerry Conley. is a 52 y.o. male is here for an colonoscopy.  Done for colon cancer screening.   Past Medical History:  Diagnosis Date  . Hypertension   . Obesity   . Sleep apnea     Past Surgical History:  Procedure Laterality Date  . THROAT SURGERY      Prior to Admission medications   Medication Sig Start Date End Date Taking? Authorizing Provider  betamethasone dipropionate (DIPROLENE) 0.05 % cream Apply topically 2 (two) times daily. 01/18/17  Yes McGowan, Larene Beach A, PA-C  betamethasone dipropionate (DIPROLENE) 0.05 % cream APPLY TOPICALLY 2 (TWO) TIMES DAILY. 03/06/18  Yes McGowan, Larene Beach A, PA-C  ferrous sulfate 325 (65 FE) MG tablet Take 325 mg by mouth daily with breakfast.   Yes [provider]  Pediatric Multivit-Minerals-C (ONE-A-DAY SCOOBY-DOO GUMMIES PO) Take by mouth.   Yes [provider]    Allergies as of 03/14/2018  . (No Known Allergies)    Family History  Problem Relation Age of Onset  . Prostate cancer Father   . Kidney cancer Neg Hx   . Bladder Cancer Neg Hx     Social History   Socioeconomic History  . Marital status: Divorced    Spouse name: Not on file  . Number of children: Not on file  . Years of education: Not on file  . Highest education level: Not on file  Occupational History  . Not on file  Social Needs  . Financial resource strain: Not on file  . Food insecurity:    Worry: Not on file    Inability: Not on file  . Transportation needs:    Medical: Not on file    Non-medical: Not on file  Tobacco Use  . Smoking status: Never Smoker  . Smokeless tobacco: Never Used  Substance and Sexual Activity  . Alcohol use: Yes  . Drug use: No  . Sexual activity: Not on file  Lifestyle  . Physical activity:    Days per week: Not on file    Minutes per session: Not on file  . Stress:  Not on file  Relationships  . Social connections:    Talks on phone: Not on file    Gets together: Not on file    Attends religious service: Not on file    Active member of club or organization: Not on file    Attends meetings of clubs or organizations: Not on file    Relationship status: Not on file  . Intimate partner violence:    Fear of current or ex partner: Not on file    Emotionally abused: Not on file    Physically abused: Not on file    Forced sexual activity: Not on file  Other Topics Concern  . Not on file  Social History Narrative  . Not on file    Review of Systems: See HPI, otherwise negative ROS  Physical Exam: BP (!) 158/81   Pulse 88   Temp (!) 97.2 F (36.2 C) (Tympanic)   Resp 20   Ht 6\' 1"  (1.854 m)   Wt (!) 149.7 kg   SpO2 100%   BMI 43.54 kg/m  General:   Alert,  pleasant and cooperative in NAD Head:  Normocephalic and atraumatic. Neck:  Supple; no masses or thyromegaly. Lungs:  Clear throughout to auscultation.    Heart:  Regular rate and rhythm. Abdomen:  Soft, nontender and nondistended. Normal bowel sounds, without guarding, and without rebound.   Neurologic:  Alert and  oriented x4;  grossly normal neurologically.  Impression/Plan: Jerry Conley. is here for an colonoscopy to be performed for colon cancer screening.  Risks, benefits, limitations, and alternatives regarding  colonoscopy have been reviewed with the patient.  Questions have been answered.  All parties agreeable.   Gaylyn Cheers, MD  06/04/2018, 11:55 AM

## 2018-06-05 ENCOUNTER — Encounter: Payer: Self-pay | Admitting: Unknown Physician Specialty

## 2018-06-06 LAB — SURGICAL PATHOLOGY

## 2019-01-19 ENCOUNTER — Ambulatory Visit: Payer: BLUE CROSS/BLUE SHIELD | Admitting: Urology

## 2019-03-17 NOTE — Progress Notes (Deleted)
03/18/2019 3:10 PM   Jerry Conley. 10/31/1965 [id number]  Referring provider: [name], MD [address] [place], [place] [id number]  No chief complaint on file.   HPI: 53 yo AAM with phimosis, BPH with LU TS and a history of hematuria who presents today for a follow up.    Phimosis ***  Background history Patient is a 53 year old African American male who is referred to [place] by his primary care physician, Dr. Ellison Hughs, for phimosis.  Patient is having difficulty pulling back the foreskin for one year.  He is experiencing some minor pain with intercourse and having trouble with maintaining with erections.  His states his foreskin and the glands are discolored.  His I PSS score today is 10, which is moderate lower urinary tract symptomatology.  He is pleased with his quality life due to his urinary symptoms.  His PVR is 32 mL.  His major complaints today are nocturia and urgency during the night.  He has just started sleeping with a CPAP machine.  He has had these symptoms for a few years.  He did have an episode of gross hematuria 6 months ago.  It occurred intermittently for two days.  He is unsure if it was due to the irritation and bleeding from his foreskin.  He denies any dysuria or suprapubic pain.   He also denies any recent fevers, chills, nausea or vomiting.  His paternal grandfather and father have been diagnosed with prostate cancer.  Prostate cancer was fatal in grandfather.  His father had surgery, but he is unsure if any other treatments were done.  His father is still living.    BPH WITH LUTS  (prostate and/or bladder) IPSS score: 4/1       Previous score: 10/1  Previous PVR: 32  Denies any dysuria, hematuria or suprapubic pain.   Denies any recent fevers, chills, nausea or vomiting.  His father and paternal grandfather have been diagnosed with prostate cancer.      Score:  1-7 Mild 8-19 Moderate 20-35 Severe   History of hematuria Non-smoker.  CTU  and cystoscopy in 02/2018 was NED.  UA today ***.  No reports of gross hematuria.      PMH: Past Medical History:  Diagnosis Date  . Hypertension   . Obesity   . Sleep apnea     Surgical History: Past Surgical History:  Procedure Laterality Date  . COLONOSCOPY WITH PROPOFOL N/A 06/04/2018   Procedure: COLONOSCOPY WITH PROPOFOL;  Surgeon: Manya Silvas, MD;  Location: [facility] ENDOSCOPY;  Service: Endoscopy;  Laterality: N/A;  . THROAT SURGERY      Home Medications:  Allergies as of 03/18/2019   No Known Allergies     Medication List       Accurate as of March 17, 2019  3:10 PM. If you have any questions, ask your nurse or doctor.        betamethasone dipropionate 0.05 % cream Commonly known as:  DIPROLENE Apply topically 2 (two) times daily.   betamethasone dipropionate 0.05 % cream Commonly known as:  DIPROLENE APPLY TOPICALLY 2 (TWO) TIMES DAILY.   ferrous sulfate 325 (65 FE) MG tablet Take 325 mg by mouth daily with breakfast.   ONE-A-DAY SCOOBY-DOO GUMMIES PO Take by mouth.       Allergies: No Known Allergies  Family History: Family History  Problem Relation Age of Onset  . Prostate cancer Father   . Kidney cancer Neg Hx   .  Bladder Cancer Neg Hx     Social History:  reports that he has never smoked. He has never used smokeless tobacco. He reports current alcohol use. He reports that he does not use drugs.  ROS:                                        Physical Exam: There were no vitals taken for this visit.  Constitutional:  Well nourished. Alert and oriented, No acute distress. HEENT: [place] AT, moist mucus membranes.  Trachea midline, no masses. Cardiovascular: No clubbing, cyanosis, or edema. Respiratory: Normal respiratory effort, no increased work of breathing. GI: Abdomen is soft, non tender, non distended, no abdominal masses. Liver and spleen not palpable.  No hernias appreciated.  Stool sample for occult testing is not  indicated.   GU: No CVA tenderness.  No bladder fullness or masses.  Patient with circumcised/uncircumcised phallus. ***Foreskin easily retracted***  Urethral meatus is patent.  No penile discharge. No penile lesions or rashes. Scrotum without lesions, cysts, rashes and/or edema.  Testicles are located scrotally bilaterally. No masses are appreciated in the testicles. Left and right epididymis are normal. Rectal: Patient with  normal sphincter tone. Anus and perineum without scarring or rashes. No rectal masses are appreciated. Prostate is approximately *** grams, *** nodules are appreciated. Seminal vesicles are normal. Skin: No rashes, bruises or suspicious lesions. Lymph: No cervical or inguinal adenopathy. Neurologic: Grossly intact, no focal deficits, moving all 4 extremities. Psychiatric: Normal mood and affect.   Laboratory Data: PSA History  0.49 ng/mL on 11/30/2016  0.45 ng/mL on 01/10/2018   Lab Results  Component Value Date   WBC 4.9 11/26/2016   HGB 13.2 11/26/2016   HCT 41.0 11/26/2016   MCV 78.8 (L) 11/26/2016   PLT 246 11/26/2016    Lab Results  Component Value Date   CREATININE 0.98 11/26/2016     Lab Results  Component Value Date   TSH 1.227 11/25/2016    Lab Results  Component Value Date   AST 27 11/26/2016   Lab Results  Component Value Date   ALT 31 11/26/2016   I have reviewed the labs.  Assessment & Plan:    1. Phimosis  - continue betamethasone dipropionate bid  - recheck in 12 months    2. BPH with LU TS   - IPSS score is 4/1, it is improving  - Continue conservative management, avoiding bladder irritants and timed voiding's  - Still sleeping with CPAP  - RTC in 12 months for IPSS, PSA and exam   3. Screening PSA   - PSA drawn today  - continue yearly screenings - of PSA remains stable  4. History of hematuria Hematuria work up completed in 02/2018 - findings positive for NED No report of gross hematuria *** UA today *** RTC in  one year for UA - patient to report any gross hematuria in the interim    No follow-ups on file.  These notes generated with voice recognition software. I apologize for typographical errors.  Zara Council, PA-C  [facility] [address] [place], [place] [address] [phone]

## 2019-03-18 ENCOUNTER — Ambulatory Visit: Payer: BLUE CROSS/BLUE SHIELD | Admitting: Urology

## 2019-05-20 ENCOUNTER — Other Ambulatory Visit: Payer: Self-pay | Admitting: Urology

## 2019-07-26 IMAGING — CT CT ABD-PEL WO/W CM
2 of 6 series · 12 of 32 positions shown, 17 images · IV contrast (APPLIED)
Comparison: None.

CLINICAL DATA: Microscopic hematuria. Dysuria during the night 1
year ago.

EXAM:
CT ABDOMEN AND PELVIS WITHOUT AND WITH CONTRAST
TECHNIQUE: Multidetector CT imaging of the abdomen and pelvis was performed
following the standard protocol before and following the bolus
administration of intravenous contrast.
CONTRAST:  125mL [id number] IOPAMIDOL (JI6YXM-8PP) INJECTION 61%

[Series 2: axial pre · axial · non-contrast · 0.79mm/px · z∈[-988,-564]mm · 6 of 119 slices shown, 11 images]
[im 17/119  soft-tissue]
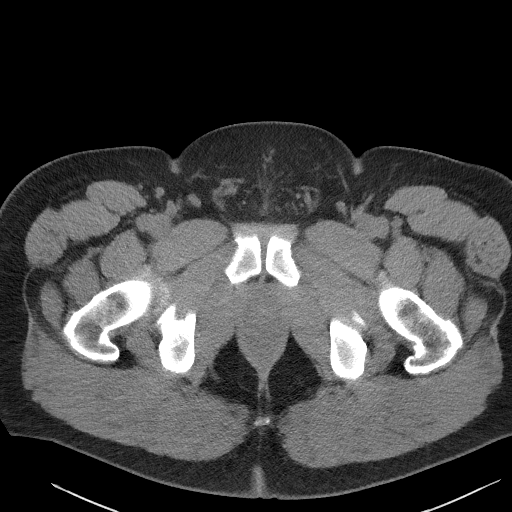
[im 17/119  bone]
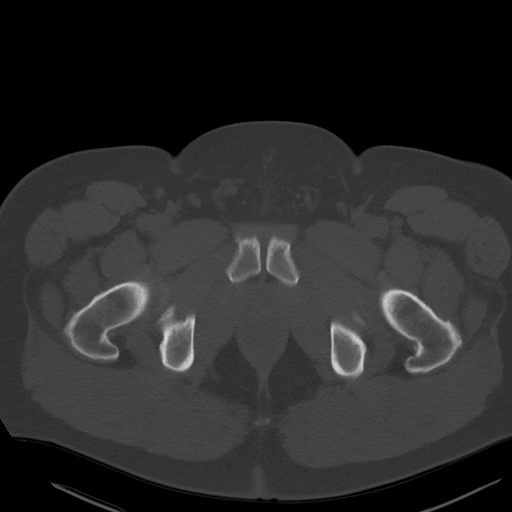
[im 34/119  soft-tissue]
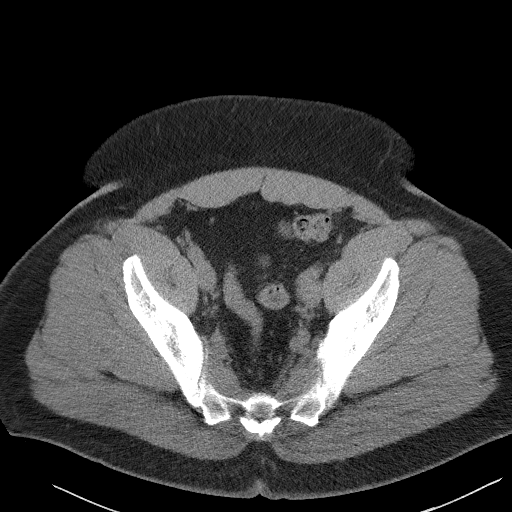
[im 51/119  soft-tissue]
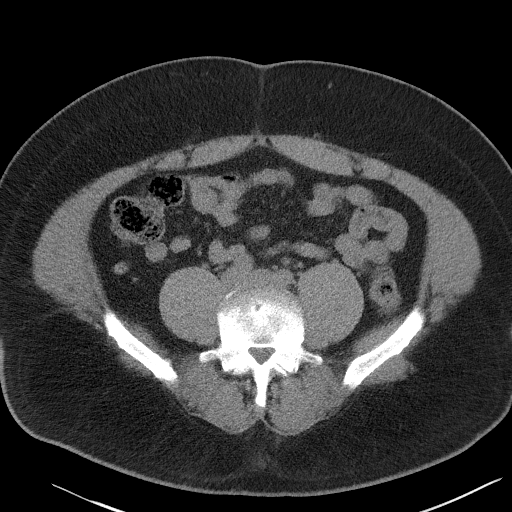
[im 51/119  lung]
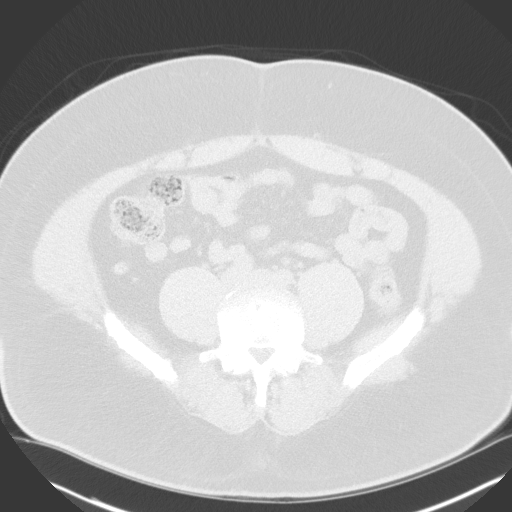
[im 68/119  soft-tissue]
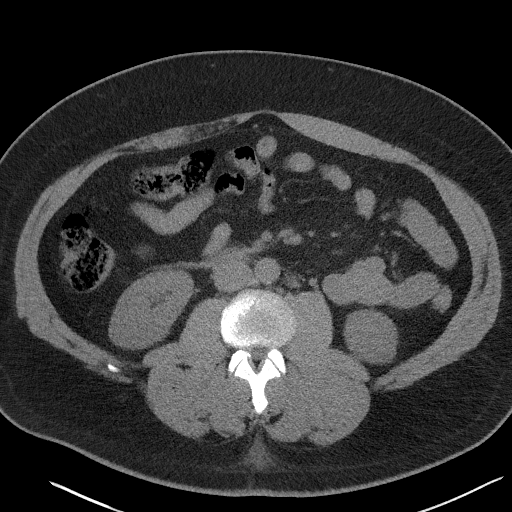
[im 68/119  lung]
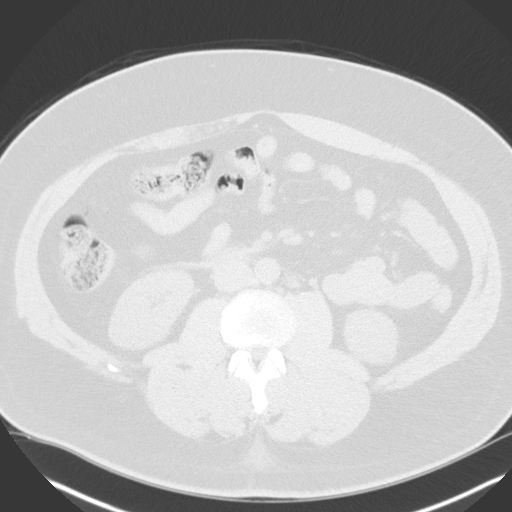
[im 85/119  soft-tissue]
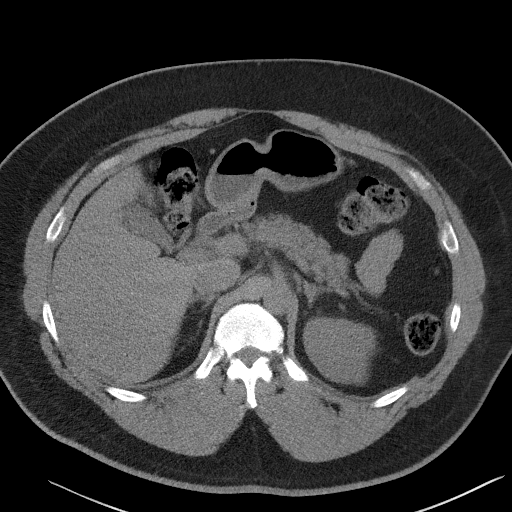
[im 85/119  lung]
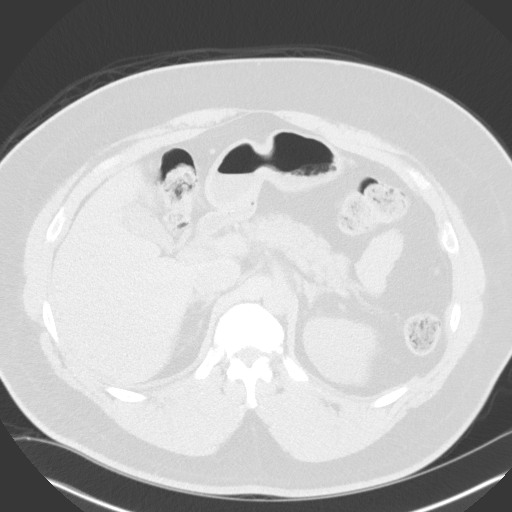
[im 102/119  soft-tissue]
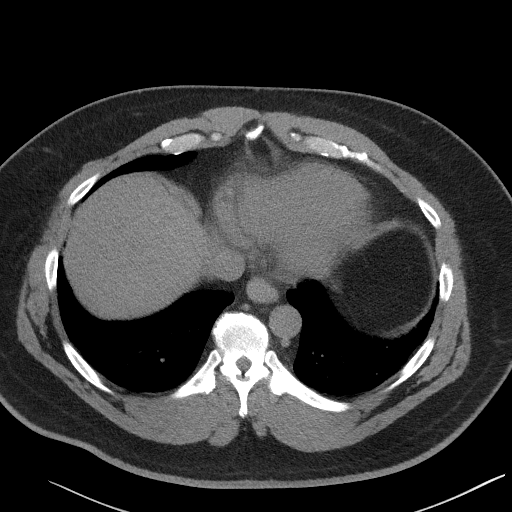
[im 102/119  lung]
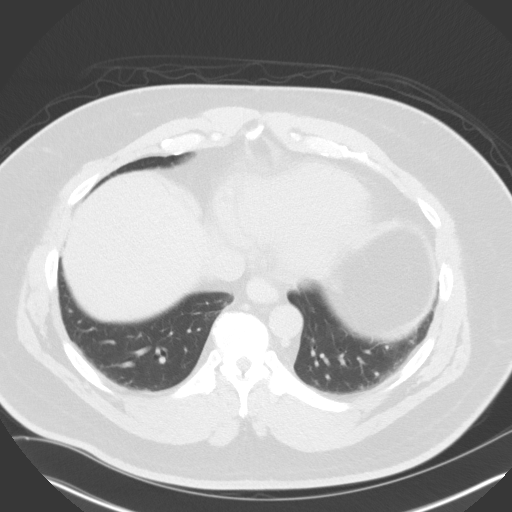

[Series 4: axial post · axial · 0.79mm/px · z∈[-988,-564]mm · 6 of 119 slices shown]
[im 17/119  soft-tissue]
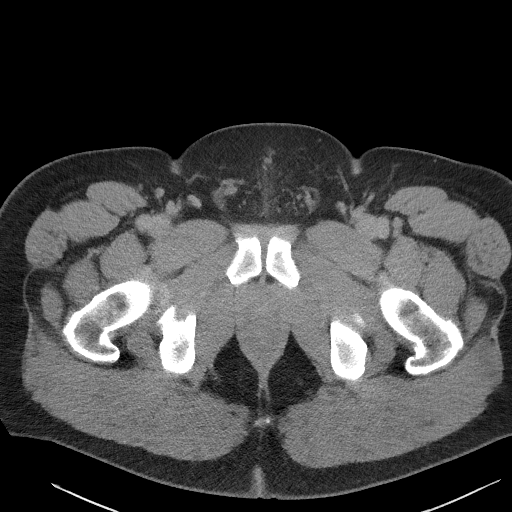
[im 34/119  soft-tissue]
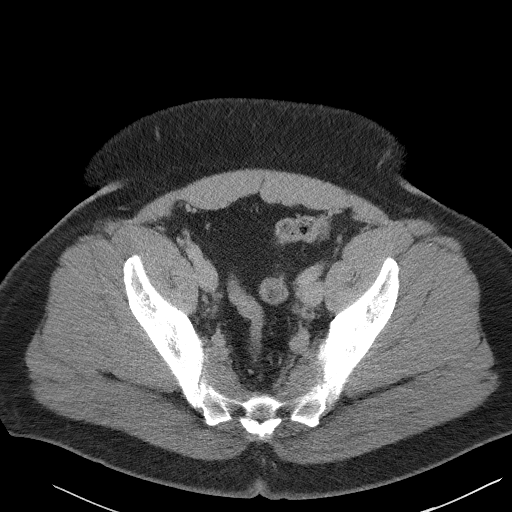
[im 51/119  soft-tissue]
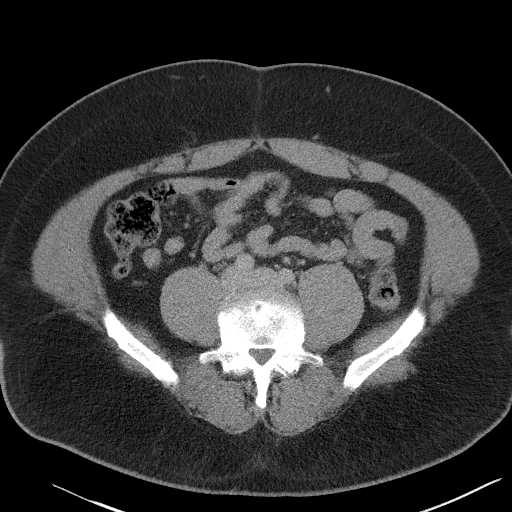
[im 68/119  soft-tissue]
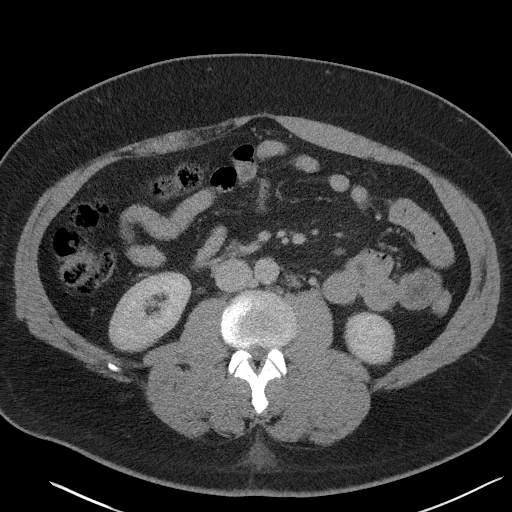
[im 85/119  soft-tissue]
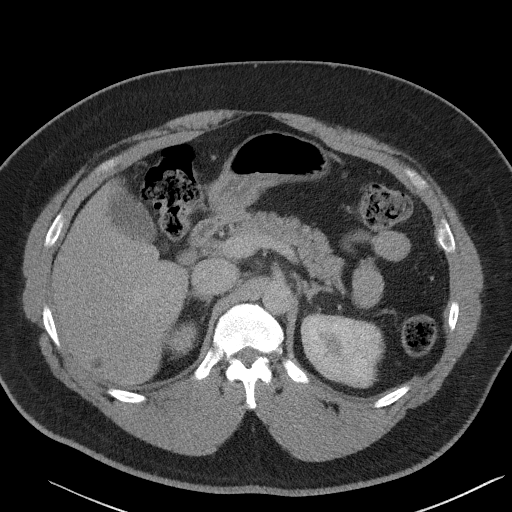
[im 102/119  soft-tissue]
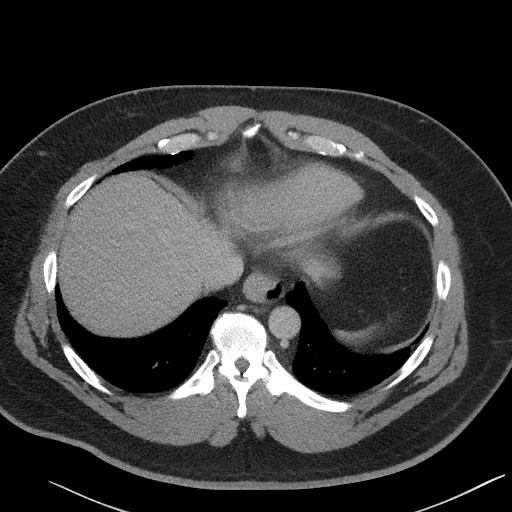

[12 of 32 positions shown; findings below may reference images not displayed]

FINDINGS: Lower chest: Clear lung bases. Normal heart size without pericardial
or pleural effusion. Tiny hiatal hernia.

Hepatobiliary: There may be a subcentimeter subcapsular right liver
lobe lesion on image [DATE] which is fluid density and favored to
represent a cyst.

9 mm hypoattenuating right liver lobe lesion on image 35/4 is
favored to represent a minimally complex cyst. Similar findings
within the high left hepatic lobe at 6 mm on image [DATE]. A right
hepatic lobe 10 mm lesion on image 37/4 is also favored to represent
a cyst or minimally complex cyst. Normal gallbladder, without
biliary ductal dilatation.

Pancreas: Fatty replacement bulb in the pancreatic head and uncinate
process. No duct dilatation or dominant mass.

Spleen: Normal in size, without focal abnormality.

Adrenals/Urinary Tract: Normal adrenal glands. No renal calculi or
hydronephrosis. No hydroureter or ureteric calculi. No bladder
calculi. No suspicious renal mass on post-contrast images. An
interpolar left renal lesion is too small to characterize at 7 mm on
image 47/4. Good renal collecting system opacification on delayed
images. Portions of the ureters are incompletely opacified. No
filling defects in the opacified portions. No enhancing bladder mass
or filling defect on delayed images.

Stomach/Bowel: Normal remainder of the stomach. Normal terminal
ileum and appendix. Normal small bowel.

Vascular/Lymphatic: Normal caliber of the aorta and branch vessels.
No abdominopelvic adenopathy.

Reproductive: Normal prostate.

Other: No significant free fluid.

Musculoskeletal: No acute osseous abnormality. Degenerative disc
disease at L4-5.
IMPRESSION: 1.  No acute process or explanation for hematuria.
2. Multiple liver lesions, favored to represent cysts and minimally
complex cysts. If any history of primary malignancy or liver
disease, consider further evaluation with dedicated pre and post
contrast abdominal MRI. If no such history, ultrasound could be
performed at 6 months to confirm stability.
3.  Tiny hiatal hernia.

## 2019-08-26 ENCOUNTER — Ambulatory Visit: Payer: BLUE CROSS/BLUE SHIELD | Admitting: Urology

## 2019-10-14 ENCOUNTER — Encounter: Payer: Self-pay | Admitting: Urology

## 2019-10-22 ENCOUNTER — Other Ambulatory Visit: Payer: Self-pay | Admitting: Urology

## 2020-03-21 ENCOUNTER — Other Ambulatory Visit: Payer: Self-pay

## 2020-03-21 ENCOUNTER — Other Ambulatory Visit
Admission: RE | Admit: 2020-03-21 | Discharge: 2020-03-21 | Disposition: A | Discharge status: Home / Self Care | Payer: BLUE CROSS/BLUE SHIELD | Attending: Urology | Admitting: Urology

## 2020-03-21 ENCOUNTER — Ambulatory Visit: Payer: BLUE CROSS/BLUE SHIELD | Admitting: Urology

## 2020-03-21 ENCOUNTER — Other Ambulatory Visit: Payer: Self-pay | Admitting: *Deleted

## 2020-03-21 ENCOUNTER — Encounter: Payer: Self-pay | Admitting: Urology

## 2020-03-21 VITALS — BP 148/75 | HR 103 | Ht 73.0 in | Wt 328.0 lb

## 2020-03-21 DIAGNOSIS — N471 Phimosis: Secondary | ICD-10-CM | POA: Diagnosis not present

## 2020-03-21 DIAGNOSIS — R310 Gross hematuria: Secondary | ICD-10-CM

## 2020-03-21 DIAGNOSIS — Z87448 Personal history of other diseases of urinary system: Secondary | ICD-10-CM

## 2020-03-21 DIAGNOSIS — N401 Enlarged prostate with lower urinary tract symptoms: Secondary | ICD-10-CM | POA: Diagnosis not present

## 2020-03-21 DIAGNOSIS — N138 Other obstructive and reflux uropathy: Secondary | ICD-10-CM | POA: Diagnosis not present

## 2020-03-21 LAB — URINALYSIS, COMPLETE (UACMP) WITH MICROSCOPIC
Bacteria, UA: NONE SEEN
Bilirubin Urine: NEGATIVE
Glucose, UA: NEGATIVE mg/dL
Hgb urine dipstick: NEGATIVE
Ketones, ur: NEGATIVE mg/dL
Leukocytes,Ua: NEGATIVE
Nitrite: NEGATIVE
Protein, ur: NEGATIVE mg/dL
RBC / HPF: NONE SEEN RBC/hpf (ref 0–5)
Specific Gravity, Urine: 1.025 (ref 1.005–1.030)
pH: 5.5 (ref 5.0–8.0)

## 2020-03-21 LAB — BLADDER SCAN AMB NON-IMAGING: Scan Result: 14

## 2020-03-21 MED ORDER — BETAMETHASONE DIPROPIONATE 0.05 % EX CREA: TOPICAL_CREAM | Freq: Two times a day (BID) | CUTANEOUS | 2 refills | Status: DC

## 2020-03-21 NOTE — Addendum Note (Signed)
Addended by: Despina Hidden on: 03/21/2020 09:00 AM   Modules accepted: Orders

## 2020-03-21 NOTE — Progress Notes (Signed)
03/21/2020 11:14 PM   Jerry Conley. July 02, 1966 [id number]  Referring provider: [name], MD [place],  [place] [address]  Chief Complaint  Patient presents with  . Follow-up    1 year follow-up, UA, PSA, IPSS, PVR    HPI: 54 yo with phimosis, BPH with LU TS and a history of hematuria who presents today for a follow up.    Phimosis Patient continues to have issues with foreskin tearing that interferes with sexual intercourse due to pain and also urination due to pain as the urine hits the cracked skin.  He is able to pull the foreskin completely back.  BPH WITH LUTS  (prostate and/or bladder) IPSS score: 5/3   PVR: 14 mL     Previous score: 4/1  Previous PVR: 32  Main complaint: When urine hits the exposed cracked skin in the foreskin, it is painful.  He has noticed that when he sleeps with his CPAP machine, he gets up once a night to urinate.  When he does not sleep with his CPAP machine, he gets up twice a night to urinate.  He mostly sleeps without the CPAP machine.  His father and paternal grandfather have been diagnosed with prostate cancer.     IPSS    Row Name 03/21/20 1500         International Prostate Symptom Score   How often have you had the sensation of not emptying your bladder? Less than 1 in 5     How often have you had to urinate less than every two hours? Not at All     How often have you found you stopped and started again several times when you urinated? Less than 1 in 5 times     How often have you found it difficult to postpone urination? Less than 1 in 5 times     How often have you had a weak urinary stream? Less than 1 in 5 times     How often have you had to strain to start urination? Not at All     How many times did you typically get up at night to urinate? 1 Time     Total IPSS Score 5       Quality of Life due to urinary symptoms   If you were to spend the rest of your life with your urinary condition just the way it  is now how would you feel about that? Mixed            Score:  1-7 Mild 8-19 Moderate 20-35 Severe   History of hematuria (hight risk) Non-smoker.  CTU 01/2018 no findings to explain hematuria.  Cysto with Dr. Erlene Quan in 02/2018 Mildly enlarged prostate with bilobar coaptation, friable prostatic vessels appreciated.  He does not report any gross hematuria since his last visit with [place].  His UA today is negative for micro heme.  PMH: Past Medical History:  Diagnosis Date  . Hypertension   . Obesity   . Sleep apnea     Surgical History: Past Surgical History:  Procedure Laterality Date  . COLONOSCOPY WITH PROPOFOL N/A 06/04/2018   Procedure: COLONOSCOPY WITH PROPOFOL;  Surgeon: Manya Silvas, MD;  Location: [facility] ENDOSCOPY;  Service: Endoscopy;  Laterality: N/A;  . THROAT SURGERY      Home Medications:  Allergies as of 03/21/2020   No Known Allergies     Medication List       Accurate as  of March 21, 2020 11:14 PM. If you have any questions, ask your nurse or doctor.        STOP taking these medications   ferrous sulfate 325 (65 FE) MG tablet Stopped by: Yolander Goodie, PA-C     TAKE these medications   betamethasone dipropionate 0.05 % cream APPLY TOPICALLY 2 (TWO) TIMES DAILY. What changed: Another medication with the same name was removed. Continue taking this medication, and follow the directions you see here. Changed by: Zara Council, PA-C   betamethasone dipropionate 0.05 % cream Apply topically 2 (two) times daily. What changed: Another medication with the same name was removed. Continue taking this medication, and follow the directions you see here. Changed by: Link Burgeson, PA-C   ONE-A-DAY SCOOBY-DOO GUMMIES PO Take by mouth.       Allergies: No Known Allergies  Family History: Family History  Problem Relation Age of Onset  . Prostate cancer Father   . Kidney cancer Neg Hx   . Bladder Cancer Neg Hx     Social History:  reports  that he has never smoked. He has never used smokeless tobacco. He reports current alcohol use. He reports that he does not use drugs.  ROS: For pertinent review of systems please refer to history of present illness  Physical Exam: BP (!) 148/75   Pulse (!) 103   Ht 6\' 1"  (1.854 m)   Wt (!) 328 lb (148.8 kg)   BMI 43.27 kg/m   Constitutional:  Well nourished. Alert and oriented, No acute distress. HEENT: [place] AT, moist mucus membranes.  Trachea midline, no masses. Cardiovascular: No clubbing, cyanosis, or edema. Respiratory: Normal respiratory effort, no increased work of breathing. GI: Abdomen is soft, non tender, non distended, no abdominal masses. Liver and spleen not palpable.  No hernias appreciated.  Stool sample for occult testing is not indicated.   GU: No CVA tenderness.  No bladder fullness or masses.  Patient with uncircumcised phallus.  Foreskin easily retracted.   Foreskin is split in a small area on the inner right side.  Vitiligo is present.  Urethral meatus is patent.  No penile discharge. No penile lesions or rashes. Scrotum without lesions, cysts, rashes and/or edema.  Testicles are located scrotally bilaterally. No masses are appreciated in the testicles. Left and right epididymis are normal. Rectal: Patient with  normal sphincter tone. Anus and perineum without scarring or rashes. No rectal masses are appreciated. Prostate is approximately 60 grams, could only palpate the apex in the midportion of the gland, no nodules are appreciated. Seminal vesicles could not be palpated. Skin: No rashes, bruises or suspicious lesions. Lymph: No inguinal adenopathy. Neurologic: Grossly intact, no focal deficits, moving all 4 extremities. Psychiatric: Normal mood and affect.  Laboratory Data: PSA History  0.49 ng/mL on 11/30/2016  0.45 ng/mL on 01/10/2018   Lab Results  Component Value Date   WBC 4.9 11/26/2016   HGB 13.2 11/26/2016   HCT 41.0 11/26/2016   MCV 78.8 (L) 11/26/2016    PLT 246 11/26/2016    Lab Results  Component Value Date   CREATININE 0.98 11/26/2016     Lab Results  Component Value Date   TSH 1.227 11/25/2016    Lab Results  Component Value Date   AST 27 11/26/2016   Lab Results  Component Value Date   ALT 31 11/26/2016   I have reviewed the labs.  Assessment & Plan:    1. Phimosis Refilled betamethasone dipropionate bid Encourage the patient to  have a circumcision, but he does not feel that he can take the 2 weeks off for recovery from his job at this time Recheck in 32month    2. BPH with LU TS  IPSS score is 5/3, it is worsening Continue conservative management, avoiding bladder irritants and timed voiding's Encourage the patient to sleep with his CPAP consistently RTC in 12 months for IPSS, PSA and exam - pending today's PSA  3. History of hematuria Hematuria work up completed in 02/2018 - findings positive for hypervascular prostate No report of gross hematuria  UA today negative for micro heme RTC in one year for UA - patient to report any gross hematuria in the interim    Return in about 1 month (around 04/20/2020) for recheck .  These notes generated with voice recognition software. I apologize for typographical errors.  Zara Council, PA-C  [facility] [address] [place], [place] [address] [phone]

## 2020-03-22 ENCOUNTER — Telehealth: Payer: Self-pay | Admitting: Family Medicine

## 2020-03-22 LAB — PSA: Prostatic Specific Antigen: 0.44 ng/mL (ref 0.00–4.00)

## 2020-03-22 NOTE — Telephone Encounter (Signed)
LMOM notified of stable PSA.

## 2020-03-22 NOTE — Telephone Encounter (Signed)
-----   Message from Nori Riis, PA-C sent at 03/22/2020 10:19 AM EDT ----- Please let Mr. Mans know his PSA is stable at 0.44.

## 2020-04-18 ENCOUNTER — Ambulatory Visit: Payer: BLUE CROSS/BLUE SHIELD | Admitting: Urology

## 2020-09-21 ENCOUNTER — Other Ambulatory Visit: Payer: Self-pay | Admitting: Urology

## 2020-09-22 NOTE — Telephone Encounter (Signed)
He needs to have an appointment if he is still having difficulties with his foreskin.

## 2020-09-22 NOTE — Telephone Encounter (Signed)
Patient states he is not having a issue with the foreskin as long as he is using the cream. He states it was discussed at the last office visit that until he has a Circumcision the cream will be the best option. He wants to continue the cream until he finds the right opportunity to have the procedure.

## 2020-09-29 NOTE — Telephone Encounter (Signed)
While he is using the cream, he needs periodic exams until he has the circumcision to make sure the foreskin doesn't develop cancer.

## 2020-10-14 ENCOUNTER — Ambulatory Visit: Payer: Self-pay | Admitting: Urology

## 2020-12-01 NOTE — Progress Notes (Signed)
12/02/2020 9:24 AM   Jerry Conley June 16, 1966 [id number]  Referring provider: [name], MD [place],  [place] [address]  Chief Complaint  Patient presents with  . Follow-up   Urological history: 1. Phimosis - managed with betamethasone dipropionate bid   2. BPH with LU TS - PSA 0.44 ng/mL in 03/2020 - I PSS 5/3 - PVR 14 mL  3. Nocturia - managed with CPAP  4. Family history of prostate cancer - father and paternal grandfather with prostate cancer  5. High risk hematuria - non-smoker - CTU and cysto 2019 - mildly enlarged prostate with bilobar coaptation, friable prostatic vessels appreciated - UA neg for micro heme - 03/2020  HPI: Jerry Conley. is a 55 y.o. male who presents today to check his foreskin and have a medication refill.   He has been applying the betamethasone dipropionate 0.05% cream twice daily.  He states his foreskin is more pliable.  It is no longer interfering with intercourse.  He is also not having tearing of the foreskin causing discomfort.  Patient denies any modifying or aggravating factors.  Patient denies any gross hematuria, dysuria or suprapubic/flank pain.  Patient denies any fevers, chills, nausea or vomiting.    PMH: Past Medical History:  Diagnosis Date  . Hypertension   . Obesity   . Sleep apnea     Surgical History: Past Surgical History:  Procedure Laterality Date  . COLONOSCOPY WITH PROPOFOL N/A 06/04/2018   Procedure: COLONOSCOPY WITH PROPOFOL;  Surgeon: Manya Silvas, MD;  Location: [facility] ENDOSCOPY;  Service: Endoscopy;  Laterality: N/A;  . THROAT SURGERY      Home Medications:  Allergies as of 12/02/2020   No Known Allergies     Medication List       Accurate as of December 02, 2020  9:24 AM. If you have any questions, ask your nurse or doctor.        betamethasone dipropionate 0.05 % cream Apply topically 2 (two) times daily.   ONE-A-DAY SCOOBY-DOO GUMMIES PO Take by  mouth.       Allergies: No Known Allergies  Family History: Family History  Problem Relation Age of Onset  . Prostate cancer Father   . Kidney cancer Neg Hx   . Bladder Cancer Neg Hx     Social History:  reports that he has never smoked. He has never used smokeless tobacco. He reports current alcohol use. He reports that he does not use drugs.  ROS: For pertinent review of systems please refer to history of present illness  Physical Exam: BP 140/86 (BP Location: Left Arm, Patient Position: Sitting, Cuff Size: Large)   Pulse 89   Ht _0  (1.854 m)   Wt (!) 335 lb (152 kg)   BMI 44.20 kg/m   Constitutional:  Well nourished. Alert and oriented, No acute distress. HEENT: [place] AT, mask in place.  Trachea midline Cardiovascular: No clubbing, cyanosis, or edema. Respiratory: Normal respiratory effort, no increased work of breathing. GI: Abdomen is soft, non tender, non distended, no abdominal masses. Liver and spleen not palpable.  No hernias appreciated.  Stool sample for occult testing is not indicated.   GU: No CVA tenderness.  No bladder fullness or masses.  Patient with uncircumcised phallus.  Foreskin easily retracted.  Vitiligo is present.  Urethral meatus is patent.  No penile discharge. No penile lesions or rashes.  Neurologic: Grossly intact, no focal deficits, moving all 4 extremities. Psychiatric:  Normal mood and affect.  Laboratory Data: Specimen:  Blood  Ref Range & Units 7 mo ago  WBC (White Blood Cell Count) 4.1 - 10.2 10^3/uL 4.8   RBC (Red Blood Cell Count) 4.69 - 6.13 10^6/uL 5.24   Hemoglobin 14.1 - 18.1 gm/dL 13.2Low   Hematocrit 40.0 - 52.0 % 42.4   MCV (Mean Corpuscular Volume) 80.0 - 100.0 fl 80.9   MCH (Mean Corpuscular Hemoglobin) 27.0 - 31.2 pg 25.2Low   MCHC (Mean Corpuscular Hemoglobin Concentration) 32.0 - 36.0 gm/dL 31.1Low   Platelet Count 150 - 450 10^3/uL 255   RDW-CV (Red Cell Distribution Width) 11.6 - 14.8 % 14.1   MPV (Mean Platelet  Volume) 9.4 - 12.4 fl 8.7Low   Neutrophils 1.50 - 7.80 10^3/uL 2.40   Lymphocytes 1.00 - 3.60 10^3/uL 2.00   Mixed Count 0.10 - 0.90 10^3/uL 0.40   Neutrophil % 32.0 - 70.0 % 51.4   Lymphocyte % 10.0 - 50.0 % 41.0   Mixed % 3.0 - 14.4 % 7.6   Resulting Agency  [facility] - [facility]  Specimen Collected: 04/13/20 2:52 PM Last Resulted: 04/13/20 3:18 PM  Received From: [facility]  Result Received: 09/22/20 8:14 AM   Specimen:  Blood  Ref Range & Units 7 mo ago  Glucose 70 - 110 mg/dL 78   Sodium 136 - 145 mmol/L 139   Potassium 3.6 - 5.1 mmol/L 4.3   Chloride 97 - 109 mmol/L 103   Carbon Dioxide (CO2) 22.0 - 32.0 mmol/L 28.8   Urea Nitrogen (BUN) 7 - 25 mg/dL 15   Creatinine 0.7 - 1.3 mg/dL 1.0   Glomerular Filtration Rate (eGFR), MDRD Estimate >60 mL/min/1.73sq m 95   Calcium 8.7 - 10.3 mg/dL 9.4   AST  8 - 39 U/L 24   ALT  6 - 57 U/L 30   Alk Phos (alkaline Phosphatase) 34 - 104 U/L 66   Albumin 3.5 - 4.8 g/dL 4.3   Bilirubin, Total 0.3 - 1.2 mg/dL 0.5   Protein, Total 6.1 - 7.9 g/dL 7.1   A/G Ratio 1.0 - 5.0 gm/dL 1.5   Resulting Agency  [facility] - [facility]  Specimen Collected: 04/13/20 2:52 PM Last Resulted: 04/14/20 2:58 PM  Received From: [facility]  Result Received: 09/22/20 8:14 AM   Specimen:  Blood  Ref Range & Units 7 mo ago  Cholesterol, Total 100 - 200 mg/dL 139   Triglyceride 35 - 199 mg/dL 88   HDL (High Density Lipoprotein) Cholesterol 29.0 - 71.0 mg/dL 44.6   LDL Calculated 0 - 130 mg/dL 77   VLDL Cholesterol mg/dL 18   Cholesterol/HDL Ratio  3.1   Resulting Agency  [facility] - [facility]  Specimen Collected: 04/13/20 2:52 PM Last Resulted: 04/14/20 3:04 PM  Received From: [facility]  Result Received: 09/22/20 8:14 AM   Specimen:  Blood  Ref Range & Units 7 mo ago  Thyroid Stimulating Hormone (TSH) 0.450-5.330 uIU/ml uIU/mL 1.667   Resulting Agency  [facility]  - LAB  Specimen Collected: 04/13/20 2:52 PM Last Resulted: 04/14/20 3:05 PM  Received From: [facility]  Result Received: 09/22/20 8:14 AM  I have reviewed the labs.  Assessment & Plan:    1. Phimosis -Refill given for Diprolene cream -Patient is still considering circumcision at this time    2. BPH with LU TS  -No urinary complaints at this visit  3. History of  hematuria -No reports of gross hematuria  Return in about 5 months (around 05/01/2021) for Keep 07/11/appoinment in [place] .  These notes generated with voice recognition software. I apologize for typographical errors.  Zara Council, PA-C  [facility] [address] [place], [place] [address] [phone]

## 2020-12-02 ENCOUNTER — Encounter: Payer: Self-pay | Admitting: Urology

## 2020-12-02 ENCOUNTER — Other Ambulatory Visit: Payer: Self-pay

## 2020-12-02 ENCOUNTER — Ambulatory Visit (INDEPENDENT_AMBULATORY_CARE_PROVIDER_SITE_OTHER): Payer: 59 | Admitting: Urology

## 2020-12-02 ENCOUNTER — Telehealth: Payer: Self-pay | Admitting: Urology

## 2020-12-02 VITALS — BP 140/86 | HR 89 | Ht 73.0 in | Wt 335.0 lb

## 2020-12-02 DIAGNOSIS — N138 Other obstructive and reflux uropathy: Secondary | ICD-10-CM | POA: Diagnosis not present

## 2020-12-02 DIAGNOSIS — N401 Enlarged prostate with lower urinary tract symptoms: Secondary | ICD-10-CM | POA: Diagnosis not present

## 2020-12-02 DIAGNOSIS — Z87448 Personal history of other diseases of urinary system: Secondary | ICD-10-CM

## 2020-12-02 DIAGNOSIS — N471 Phimosis: Secondary | ICD-10-CM

## 2020-12-02 DIAGNOSIS — R319 Hematuria, unspecified: Secondary | ICD-10-CM | POA: Diagnosis not present

## 2020-12-02 MED ORDER — BETAMETHASONE DIPROPIONATE 0.05 % EX CREA: TOPICAL_CREAM | Freq: Two times a day (BID) | CUTANEOUS | 2 refills | Status: DC

## 2020-12-02 NOTE — Telephone Encounter (Signed)
I forgot to mention to Mr. Jerry Conley that when he comes to the lab in [place] prior to his July appointment in addition to the PSA I will also need an UA.  Would you call him and advise him of this and also put the order in for the urinalysis from abdomen?

## 2020-12-02 NOTE — Patient Instructions (Signed)
Drop by the lab in [place] a few days prior to your appointment for your PSA.

## 2020-12-02 NOTE — Telephone Encounter (Signed)
Ok order added

## 2021-04-14 NOTE — Progress Notes (Deleted)
04/17/2021 10:39 AM   Jerry Conley [birth year] [id number]  Referring provider: [name], MD [place],  [place] [address]  Urological history: 1. Phimosis - managed with betamethasone dipropionate bid   2. BPH with LU TS - PSA 0.44 ng/mL in 03/2020 - I PSS 5/3 - PVR 14 mL  3. Nocturia - managed with CPAP  4. Family history of prostate cancer - father and paternal grandfather with prostate cancer  5. High risk hematuria - non-smoker - CTU and cysto 2019 - mildly enlarged prostate with bilobar coaptation, friable prostatic vessels appreciated - UA neg for micro heme - 03/2020  No chief complaint on file.   HPI: Jerry Conley. is a 55 y.o. male who presents today for 6 month follow up.    Score:  1-7 Mild 8-19 Moderate 20-35 Severe     Score: 1-7 Severe ED 8-11 Moderate ED 12-16 Mild-Moderate ED 17-21 Mild ED 22-25 No ED   PMH: Past Medical History:  Diagnosis Date   Hypertension    Obesity    Sleep apnea     Surgical History: Past Surgical History:  Procedure Laterality Date   COLONOSCOPY WITH PROPOFOL N/A 06/04/2018   Procedure: COLONOSCOPY WITH PROPOFOL;  Surgeon: Manya Silvas, MD;  Location: [facility] ENDOSCOPY;  Service: Endoscopy;  Laterality: N/A;   THROAT SURGERY      Home Medications:  Allergies as of 04/17/2021   No Known Allergies      Medication List        Accurate as of April 14, 2021 10:39 AM. If you have any questions, ask your nurse or doctor.          betamethasone dipropionate 0.05 % cream Apply topically 2 (two) times daily.   ONE-A-DAY SCOOBY-DOO GUMMIES PO Take by mouth.        Allergies: No Known Allergies  Family History: Family History  Problem Relation Age of Onset   Prostate cancer Father    Kidney cancer Neg Hx    Bladder Cancer Neg Hx     Social History:  reports that he has never smoked. He has never used smokeless tobacco. He reports current alcohol use. He  reports that he does not use drugs.  ROS: For pertinent review of systems please refer to history of present illness  Physical Exam: There were no vitals taken for this visit.  Constitutional:  Well nourished. Alert and oriented, No acute distress. HEENT: [place] AT, moist mucus membranes.  Trachea midline Cardiovascular: No clubbing, cyanosis, or edema. Respiratory: Normal respiratory effort, no increased work of breathing. GI: Abdomen is soft, non tender, non distended, no abdominal masses. Liver and spleen not palpable.  No hernias appreciated.  Stool sample for occult testing is not indicated.   GU: No CVA tenderness.  No bladder fullness or masses.  Patient with circumcised/uncircumcised phallus. ***Foreskin easily retracted***  Urethral meatus is patent.  No penile discharge. No penile lesions or rashes. Scrotum without lesions, cysts, rashes and/or edema.  Testicles are located scrotally bilaterally. No masses are appreciated in the testicles. Left and right epididymis are normal. Rectal: Patient with  normal sphincter tone. Anus and perineum without scarring or rashes. No rectal masses are appreciated. Prostate is approximately *** grams, *** nodules are appreciated. Seminal vesicles are normal. Skin: No rashes, bruises or suspicious lesions. Lymph: No inguinal adenopathy. Neurologic: Grossly intact, no focal deficits, moving all 4 extremities. Psychiatric: Normal mood and affect.   Laboratory Data:  Urinalysis *** I have reviewed the labs.  Assessment & Plan:    1. Phimosis -Refill given for Diprolene cream -Patient is still considering circumcision at this time    2. BPH with LU TS  -No urinary complaints at this visit  3. History of hematuria -No reports of gross hematuria  No follow-ups on file.  These notes generated with voice recognition software. I apologize for typographical errors.  Zara Council, PA-C  [facility] [address]  [place], [place] [address] [phone]

## 2021-04-17 ENCOUNTER — Ambulatory Visit: Payer: BLUE CROSS/BLUE SHIELD | Admitting: Urology

## 2021-07-09 NOTE — Progress Notes (Addendum)
07/10/2021 9:28 AM   Jerry Conley [birth year] [id number]  Referring provider: [name], MD [name],  [place] [address]  Chief Complaint  Patient presents with   Follow-up    60mh follow-up Phimosis    Urological history: 1. Phimosis - managed with betamethasone dipropionate bid   2. BPH with LU TS - PSA 0.51 ng/mL in 04/2021 - I PSS 9/1  3. Nocturia - managed with CPAP  4. Family history of prostate cancer - father and paternal grandfather with prostate cancer  5. High risk hematuria - non-smoker - CTU and cysto 2019 - mildly enlarged prostate with bilobar coaptation, friable prostatic vessels appreciated - no reports of gross heme - UA 6-10 RBC's  HPI: Jerry Conley is a 55y.o. male who presents today for yearly follow up.   No urinary complaints at this visit.  Patient denies any modifying or aggravating factors.  Patient denies any gross hematuria, dysuria or suprapubic/flank pain.  Patient denies any fevers, chills, nausea or vomiting.     IPSS     Row Name 07/10/21 0900         International Prostate Symptom Score   How often have you had the sensation of not emptying your bladder? Less than 1 in 5     How often have you had to urinate less than every two hours? Less than 1 in 5 times     How often have you found you stopped and started again several times when you urinated? Less than half the time     How often have you found it difficult to postpone urination? Less than 1 in 5 times     How often have you had a weak urinary stream? Less than half the time     How often have you had to strain to start urination? Less than 1 in 5 times     How many times did you typically get up at night to urinate? 1 Time     Total IPSS Score 9           Quality of Life due to urinary symptoms   If you were to spend the rest of your life with your urinary condition just the way it is now how would you feel about that? Pleased               Score:  1-7 Mild 8-19 Moderate 20-35 Severe   PMH: Past Medical History:  Diagnosis Date   Hypertension    Obesity    Sleep apnea     Surgical History: Past Surgical History:  Procedure Laterality Date   COLONOSCOPY WITH PROPOFOL N/A 06/04/2018   Procedure: COLONOSCOPY WITH PROPOFOL;  Surgeon: EManya Silvas MD;  [facility];  Service: Endoscopy;  Laterality: N/A;   THROAT SURGERY      Home Medications:  Allergies as of 07/10/2021   No Known Allergies      Medication List        Accurate as of July 10, 2021  9:28 AM. If you have any questions, ask your nurse or doctor.          betamethasone dipropionate 0.05 % cream Apply topically 2 (two) times daily.   ONE-A-DAY SCOOBY-DOO GUMMIES PO Take by mouth.        Allergies: No Known Allergies  Family History: Family History  Problem Relation Age of Onset   Prostate cancer Father  Kidney cancer Neg Hx    Bladder Cancer Neg Hx     Social History:  reports that he has never smoked. He has never used smokeless tobacco. He reports current alcohol use. He reports that he does not use drugs.  ROS: For pertinent review of systems please refer to history of present illness  Physical Exam: BP (!) 144/80   Pulse (!) 130   Ht '6\' 1"'  (1.854 m)   Wt (!) 336 lb (152.4 kg)   BMI 44.33 kg/m   Constitutional:  Well nourished. Alert and oriented, No acute distress. HEENT: [place] AT, moist mucus membranes.  Trachea midline Cardiovascular: No clubbing, cyanosis, or edema. Respiratory: Normal respiratory effort, no increased work of breathing. GU: No CVA tenderness.  No bladder fullness or masses.  Patient with uncircumcised phallus. Foreskin easily retracted  There is some difficulty placing the foreskin over the glans after exam.  Vitiligo vs penile melanosis present. Urethral meatus is patent.  No penile discharge. No penile lesions or rashes. Scrotum without lesions, cysts, rashes and/or  edema.  Testicles are located scrotally bilaterally. No masses are appreciated in the testicles. Left and right epididymis are normal. Rectal: Patient with  normal sphincter tone. Anus and perineum without scarring or rashes. No rectal masses are appreciated. Prostate is approximately 60 + grams, could only palpate the apex, no nodules are appreciated. Seminal vesicles could not be palpated Neurologic: Grossly intact, no focal deficits, moving all 4 extremities. Psychiatric: Normal mood and affect.   Laboratory Data: PSA (Prostate Specific Antigen), Total 0.10 - 4.00 ng/mL 0.51   Resulting Agency  [facility] - LAB  Narrative Performed by [facility] - LAB Test results were determined with Beckman Coulter Hybritech Assay. Values obtained with different assay methods cannot be used interchangeably in serial testing. Assay results should not be interpreted as absolute evidence of the presence or absence of malignant disease Specimen Collected: 04/14/21 10:19 Last Resulted: 04/14/21 15:57  Received From: [place]  Result Received: 04/17/21 08:33   WBC (White Blood Cell Count) 4.1 - 10.2 10^3/uL 4.9   RBC (Red Blood Cell Count) 4.69 - 6.13 10^6/uL 5.31   Hemoglobin 14.1 - 18.1 gm/dL 13.5 Low    Hematocrit 40.0 - 52.0 % 42.8   MCV (Mean Corpuscular Volume) 80.0 - 100.0 fl 80.6   MCH (Mean Corpuscular Hemoglobin) 27.0 - 31.2 pg 25.4 Low    MCHC (Mean Corpuscular Hemoglobin Concentration) 32.0 - 36.0 gm/dL 31.5 Low    Platelet Count 150 - 450 10^3/uL 251   RDW-CV (Red Cell Distribution Width) 11.6 - 14.8 % 14.6   MPV (Mean Platelet Volume) 9.4 - 12.4 fl 9.3 Low    Neutrophils 1.50 - 7.80 10^3/uL 2.40   Lymphocytes 1.00 - 3.60 10^3/uL 2.10   Mixed Count 0.10 - 0.90 10^3/uL 0.40   Neutrophil % 32.0 - 70.0 % 49.1   Lymphocyte % 10.0 - 50.0 % 42.7   Mixed % 3.0 - 14.4 % 8.2   Resulting Agency  [facility] - LAB  Specimen Collected: 04/14/21 10:19  Last Resulted: 04/14/21 11:36  Received From: [place]  Result Received: 04/17/21 08:33   Thyroid Stimulating Hormone (TSH) 0.450-5.330 uIU/ml uIU/mL 1.452   Resulting Agency  [place] - LAB  Specimen Collected: 04/14/21 10:19 Last Resulted: 04/14/21 15:55  Received From: [place]  Result Received: 04/17/21 08:33   Cholesterol, Total 100 - 200 mg/dL 153   Triglyceride 35 -  199 mg/dL 106   HDL (High Density Lipoprotein) Cholesterol 29.0 - 71.0 mg/dL 50.2   LDL Calculated 0 - 130 mg/dL 82   VLDL Cholesterol mg/dL 21   Cholesterol/HDL Ratio  3.0   Resulting Agency  [place] - LAB  Specimen Collected: 04/14/21 10:19 Last Resulted: 04/14/21 16:09  Received From: [place]  Result Received: 04/17/21 08:33   Glucose 70 - 110 mg/dL 89   Sodium 136 - 145 mmol/L 141   Potassium 3.6 - 5.1 mmol/L 4.6   Chloride 97 - 109 mmol/L 105   Carbon Dioxide (CO2) 22.0 - 32.0 mmol/L 29.3   Urea Nitrogen (BUN) 7 - 25 mg/dL 14   Creatinine 0.7 - 1.3 mg/dL 0.9   Glomerular Filtration Rate (eGFR), MDRD Estimate >60 mL/min/1.73sq m 107   Calcium 8.7 - 10.3 mg/dL 9.5   AST  8 - 39 U/L 27   ALT  6 - 57 U/L 40   Alk Phos (alkaline Phosphatase) 34 - 104 U/L 73   Albumin 3.5 - 4.8 g/dL 4.3   Bilirubin, Total 0.3 - 1.2 mg/dL 0.4   Protein, Total 6.1 - 7.9 g/dL 7.3   A/G Ratio 1.0 - 5.0 gm/dL 1.4   Resulting Agency  [facility] - LAB  Specimen Collected: 04/14/21 10:19 Last Resulted: 04/14/21 16:09  Received From: [place]  Result Received: 04/17/21 08:33   Urinalysis Component     Latest Ref Rng & Units 07/10/2021  Color, Urine     YELLOW YELLOW  Appearance     CLEAR CLEAR  Specific Gravity, Urine     1.005 - 1.030 1.025  pH     5.0 - 8.0 5.5  Glucose, UA     NEGATIVE mg/dL NEGATIVE  Hgb urine dipstick     NEGATIVE TRACE (A)  Bilirubin Urine     NEGATIVE SMALL (A)  Ketones, ur      NEGATIVE mg/dL 15 (A)  Protein     NEGATIVE mg/dL 30 (A)  Nitrite     NEGATIVE NEGATIVE  Leukocytes,Ua     NEGATIVE NEGATIVE  Squamous Epithelial / LPF     0 - 5 0-5  WBC, UA     0 - 5 WBC/hpf 0-5  RBC / HPF     0 - 5 RBC/hpf 6-10  Bacteria, UA     NONE SEEN FEW (A)  Mucus      PRESENT  I have reviewed the labs.  Assessment & Plan:    1. Phimosis -Refill given for Diprolene cream -Patient is still considering circumcision at this time, but he has a difficult time getting time off work    2. BPH with LU TS  -PSA stable -DRE benign -UA with micro heme -continue conservative management, avoiding bladder irritants and timed voiding's  3. Microscopic hematuria -No reports of gross hematuria - I explained to the patient that there are a number of causes that can be associated with blood in the urine, such as stones, BPH, UTI's, damage to the urinary tract and/or cancer. -at this time, they are in a intermediate risk stratification for hematuria per AUA guidelines due to their age (40-59 years)  -recommended studies for intermediate risk are RUS and cysto - Following the imaging study,  I've recommended a cystoscopy. I described how this is performed, typically in an office setting with a flexible cystoscope. We described the risks, benefits, and possible side effects, the most common of which is a  minor amount of blood in the urine and/or burning which usually resolves in 24 to 48 hours.   - The patient had the opportunity to ask questions which were answered. Based upon this discussion, the patient is willing to proceed. Therefore, I've ordered: a RUS and cystoscopy. - The patient will return following all of the above for discussion of the results.  - UA  Return for RUS and cysto for micro heme with Dr. Erlene Quan in [place] .  These notes generated with voice recognition software. I apologize for typographical errors.  Zara Council, PA-C  [facility] [address] [place], [place] [address] [phone]

## 2021-07-10 ENCOUNTER — Ambulatory Visit (INDEPENDENT_AMBULATORY_CARE_PROVIDER_SITE_OTHER): Payer: 59 | Admitting: Urology

## 2021-07-10 ENCOUNTER — Other Ambulatory Visit
Admission: RE | Admit: 2021-07-10 | Discharge: 2021-07-10 | Disposition: A | Discharge status: Home / Self Care | Payer: 59 | Attending: Urology | Admitting: Urology

## 2021-07-10 ENCOUNTER — Encounter: Payer: Self-pay | Admitting: Urology

## 2021-07-10 ENCOUNTER — Other Ambulatory Visit: Payer: Self-pay

## 2021-07-10 ENCOUNTER — Other Ambulatory Visit: Payer: Self-pay | Admitting: *Deleted

## 2021-07-10 VITALS — BP 144/80 | HR 130 | Ht 73.0 in | Wt 336.0 lb

## 2021-07-10 DIAGNOSIS — N138 Other obstructive and reflux uropathy: Secondary | ICD-10-CM

## 2021-07-10 DIAGNOSIS — R3129 Other microscopic hematuria: Secondary | ICD-10-CM | POA: Diagnosis not present

## 2021-07-10 DIAGNOSIS — N401 Enlarged prostate with lower urinary tract symptoms: Secondary | ICD-10-CM | POA: Diagnosis present

## 2021-07-10 DIAGNOSIS — R319 Hematuria, unspecified: Secondary | ICD-10-CM

## 2021-07-10 DIAGNOSIS — N471 Phimosis: Secondary | ICD-10-CM

## 2021-07-10 LAB — URINALYSIS, COMPLETE (UACMP) WITH MICROSCOPIC
Glucose, UA: NEGATIVE mg/dL
Ketones, ur: 15 mg/dL — AB
Leukocytes,Ua: NEGATIVE
Nitrite: NEGATIVE
Protein, ur: 30 mg/dL — AB
Specific Gravity, Urine: 1.025 (ref 1.005–1.030)
pH: 5.5 (ref 5.0–8.0)

## 2021-07-10 MED ORDER — BETAMETHASONE DIPROPIONATE 0.05 % EX CREA: TOPICAL_CREAM | Freq: Two times a day (BID) | CUTANEOUS | 2 refills | Status: DC

## 2021-07-26 ENCOUNTER — Ambulatory Visit
Admission: RE | Admit: 2021-07-26 | Discharge: 2021-07-26 | Disposition: A | Discharge status: Home / Self Care | Payer: 59 | Source: Ambulatory Visit | Attending: Urology | Admitting: Urology

## 2021-07-26 ENCOUNTER — Other Ambulatory Visit: Payer: Self-pay

## 2021-07-26 DIAGNOSIS — R3129 Other microscopic hematuria: Secondary | ICD-10-CM

## 2021-08-02 ENCOUNTER — Telehealth: Payer: Self-pay | Admitting: Urology

## 2021-08-02 DIAGNOSIS — R3129 Other microscopic hematuria: Secondary | ICD-10-CM

## 2021-08-02 NOTE — Telephone Encounter (Addendum)
It was recommended that the patient have a renal ultrasound and cysto.   Per patient, renal ultrasound was negative.  He would like to come in to repeat the urinalysis and culture prior to scheduling a cysto.  Please advise.  Recommendations are based off 1 incident of microscopic hematuria of 1 urinalysis.  Urine cultures not necessary as urinalysis did not show any signs of urinary tract infection.  I still recommend that he undergo cystoscopy.

## 2021-08-04 ENCOUNTER — Other Ambulatory Visit: Payer: 59 | Admitting: Urology

## 2021-09-12 NOTE — Addendum Note (Signed)
Addended by: Despina Hidden on: 09/12/2021 01:46 PM   Modules accepted: Orders

## 2021-09-12 NOTE — Telephone Encounter (Signed)
Called pt and he stated this issue was back in October. Per pt he wanted to avoid the cost of the Cysto ( quoted $1000.00) if the issue was his foreskin and bacteria. I explained how the cysto looks inside the bladder and the ultrasound does not. Pt paid out of pocket for the RUS and that was negative. Pt would like a repeat UA.

## 2021-09-12 NOTE — Telephone Encounter (Signed)
Spoke with patient and advised results  UA order entered

## 2021-09-21 ENCOUNTER — Other Ambulatory Visit: Payer: Self-pay

## 2021-09-21 ENCOUNTER — Other Ambulatory Visit
Admission: RE | Admit: 2021-09-21 | Discharge: 2021-09-21 | Disposition: A | Discharge status: Home / Self Care | Payer: 59 | Attending: Urology | Admitting: Urology

## 2021-09-21 DIAGNOSIS — Z87448 Personal history of other diseases of urinary system: Secondary | ICD-10-CM | POA: Diagnosis present

## 2021-09-21 DIAGNOSIS — N138 Other obstructive and reflux uropathy: Secondary | ICD-10-CM

## 2021-09-21 LAB — URINALYSIS, COMPLETE (UACMP) WITH MICROSCOPIC
Bilirubin Urine: NEGATIVE
Glucose, UA: NEGATIVE mg/dL
Hgb urine dipstick: NEGATIVE
Ketones, ur: NEGATIVE mg/dL
Leukocytes,Ua: NEGATIVE
Nitrite: NEGATIVE
Protein, ur: NEGATIVE mg/dL
Specific Gravity, Urine: 1.02 (ref 1.005–1.030)
pH: 6 (ref 5.0–8.0)

## 2022-07-07 NOTE — Progress Notes (Unsigned)
07/09/2022 9:13 AM   Jerry Conley 1965-11-25 [id number]  Referring provider: [name], MD [name],  [name]  Urological history: 1. Phimosis - managed with betamethasone dipropionate bid   2. BPH with LU TS - I PSS 6/1  3. Nocturia -Risk factors for nocturia: obstructive sleep apnea (on CPAP) and BPH.    4. Prostate cancer screening -PSA (06/2022) 0.50 - AUA guidelines recommend prostate cancer screening for men 37 to 54 years of age at [place] two to 5 years - African ancestry  - father and paternal grandfather with prostate cancer  5. High risk hematuria - non-smoker - CTU and cysto 2019 - mildly enlarged prostate with bilobar coaptation, friable prostatic vessels appreciated - RUS (07/2021) - 12 mm left renal cyst - no reports of gross heme - UA 6-10 RBC's   6. Phimosis -resolved   HPI: Jerry Conley. is a 56 y.o. male who presents today for yearly follow up.   He has no urinary complaints at this time.  Patient denies any modifying or aggravating factors.  Patient denies any gross hematuria, dysuria or suprapubic/flank pain.  Patient denies any fevers, chills, nausea or vomiting.    UA 6-10 RBC's and few bacteria.      IPSS     Row Name 07/09/22 0840         International Prostate Symptom Score   How often have you had the sensation of not emptying your bladder? Less than 1 in 5     How often have you had to urinate less than every two hours? Less than 1 in 5 times     How often have you found you stopped and started again several times when you urinated? Less than 1 in 5 times     How often have you found it difficult to postpone urination? Less than 1 in 5 times     How often have you had a weak urinary stream? Less than 1 in 5 times     How often have you had to strain to start urination? Not at All     How many times did you typically get up at night to urinate? 1 Time     Total IPSS Score 6                Score:  1-7 Mild 8-19 Moderate 20-35 Severe    Patient still having spontaneous erections.  He denies any pain or curvature with erections.    SHIM     Row Name 07/09/22 0844         SHIM: Over the last 6 months:   How do you rate your confidence that you could get and keep an erection? Low     When you had erections with sexual stimulation, how often were your erections hard enough for penetration (entering your partner)? Sometimes (about half the time)     During sexual intercourse, how often were you able to maintain your erection after you had penetrated (entered) your partner? Sometimes (about half the time)     During sexual intercourse, how difficult was it to maintain your erection to completion of intercourse? Difficult     When you attempted sexual intercourse, how often was it satisfactory for you? Most Times (much more than half the time)       SHIM Total Score   SHIM 15  Score: 1-7 Severe ED 8-11 Moderate ED 12-16 Mild-Moderate ED 17-21 Mild ED 22-25 No ED   PMH: Past Medical History:  Diagnosis Date   Hypertension    Obesity    Sleep apnea     Surgical History: Past Surgical History:  Procedure Laterality Date   COLONOSCOPY WITH PROPOFOL N/A 06/04/2018   Procedure: COLONOSCOPY WITH PROPOFOL;  Surgeon: Manya Silvas, MD;  Location[facility] ENDOSCOPY;  Service: Endoscopy;  Laterality: N/A;   THROAT SURGERY      Home Medications:  Allergies as of 07/09/2022   No Known Allergies      Medication List        Accurate as of July 09, 2022  9:13 AM. If you have any questions, ask your nurse or doctor.          betamethasone dipropionate 0.05 % cream Apply topically 2 (two) times daily.   ONE-A-DAY SCOOBY-DOO GUMMIES PO Take by mouth.   sildenafil 20 MG tablet Commonly known as: REVATIO Take 1 to 5 tablets two hours before intercouse on an empty stomach.  Do not take with nitrates. Started by: Zara Council,  PA-C        Allergies: No Known Allergies  Family History: Family History  Problem Relation Age of Onset   Prostate cancer Father    Kidney cancer Neg Hx    Bladder Cancer Neg Hx     Social History:  reports that he has never smoked. He has never used smokeless tobacco. He reports current alcohol use. He reports that he does not use drugs.  ROS: For pertinent review of systems please refer to history of present illness  Physical Exam: BP 137/75   Pulse 87   Ht '6\' 1"'  (1.854 m)   Wt (!) 352 lb (159.7 kg)   BMI 46.44 kg/m   Constitutional:  Well nourished. Alert and oriented, No acute distress. HEENT: [place] AT, moist mucus membranes.  Trachea midline Cardiovascular: No clubbing, cyanosis, or edema. Respiratory: Normal respiratory effort, no increased work of breathing. GU: No CVA tenderness.  No bladder fullness or masses.  Patient with uncircumcised phallus. Foreskin easily retracted  Vitiligo present.  Urethral meatus is patent.  No penile discharge. No penile lesions or rashes. Scrotum without lesions, cysts, rashes and/or edema.  Testicles are located scrotally bilaterally. No masses are appreciated in the testicles. Left and right epididymis are normal. Rectal: Patient with  normal sphincter tone. Anus and perineum without scarring or rashes. No rectal masses are appreciated. Prostate is approximately 50 + grams, could only palpate the apex due to body habitus, no nodules are appreciated. Seminal vesicles could not be palpated Neurologic: Grossly intact, no focal deficits, moving all 4 extremities. Psychiatric: Normal mood and affect.   Laboratory Data: WBC (White Blood Cell Count) 4.1 - 10.2 10^3/uL 5.5   RBC (Red Blood Cell Count) 4.69 - 6.13 10^6/uL 5.12   Hemoglobin 14.1 - 18.1 gm/dL 12.9 Low    Hematocrit 40.0 - 52.0 % 41.4   MCV (Mean Corpuscular Volume) 80.0 - 100.0 fl 80.9   MCH (Mean Corpuscular Hemoglobin) 27.0 - 31.2 pg 25.2 Low    MCHC (Mean Corpuscular  Hemoglobin Concentration) 32.0 - 36.0 gm/dL 31.2 Low    Platelet Count 150 - 450 10^3/uL 272   RDW-CV (Red Cell Distribution Width) 11.6 - 14.8 % 14.6   MPV (Mean Platelet Volume) 9.4 - 12.4 fl 8.8 Low    Neutrophils 1.50 - 7.80 10^3/uL 2.90   Lymphocytes 1.00 - 3.60  10^3/uL 2.10   Mixed Count 0.10 - 0.90 10^3/uL 0.50   Neutrophil % 32.0 - 70.0 % 53.1   Lymphocyte % 10.0 - 50.0 % 37.4   Mixed % 3.0 - 14.4 % 9.5   Resulting Agency  [facility] - LAB   Specimen Collected: 06/19/22 16:05   Performed by: [facility] - [facility] Last Resulted: 06/19/22 16:20  Received From: [facility]  Result Received: 07/02/22 08:39   Glucose 70 - 110 mg/dL 83   Sodium 136 - 145 mmol/L 140   Potassium 3.6 - 5.1 mmol/L 4.3   Chloride 97 - 109 mmol/L 103   Carbon Dioxide (CO2) 22.0 - 32.0 mmol/L 28.7   Urea Nitrogen (BUN) 7 - 25 mg/dL 13   Creatinine 0.7 - 1.3 mg/dL 0.9   Glomerular Filtration Rate (eGFR) >60 mL/min/1.73sq m 106   Calcium 8.7 - 10.3 mg/dL 9.5   AST  8 - 39 U/L 30   ALT  6 - 57 U/L 38   Alk Phos (alkaline Phosphatase) 34 - 104 U/L 61   Albumin 3.5 - 4.8 g/dL 4.4   Bilirubin, Total 0.3 - 1.2 mg/dL 0.5   Protein, Total 6.1 - 7.9 g/dL 7.1   A/G Ratio 1.0 - 5.0 gm/dL 1.6   Resulting Agency  [facility] - LAB   Specimen Collected: 06/19/22 16:05   Performed by: [place]: 06/20/22 15:43  Received From: [place]  Result Received: 07/02/22 08:39   Cholesterol, Total 100 - 200 mg/dL 146   Triglyceride 35 - 199 mg/dL 107   HDL (High Density Lipoprotein) Cholesterol 29.0 - 71.0 mg/dL 48.0   LDL Calculated 0 - 130 mg/dL 77   VLDL Cholesterol mg/dL 21   Cholesterol/HDL Ratio  3.0   Resulting Agency  [facility] - LAB   Specimen Collected: 06/19/22 16:05   Performed by: [place]: 06/20/22 15:41  Received From: [place]  Result  Received: 07/02/22 08:39   PSA (Prostate Specific Antigen), Total 0.10 - 4.00 ng/mL 0.50   Resulting Agency  [place] - LAB  Narrative Performed by [facility] - LAB Test results were determined with Beckman Coulter Hybritech Assay. Values obtained with different assay methods cannot be used interchangeably in serial testing. Assay results should not be interpreted as absolute evidence of the presence or absence of malignant disease  Specimen Collected: 06/19/22 16:05   Performed by: [place]: 06/20/22 14:44  Received From: [place]  Result Received: 07/02/22 08:39   Thyroid Stimulating Hormone (TSH) 0.450-5.330 uIU/ml uIU/mL 1.653   Resulting Agency  [facility] - LAB   Specimen Collected: 06/19/22 16:05   Performed by: [facility] - [facility] Last Resulted: 06/20/22 14:44  Received From: [facility]  Result Received: 07/02/22 08:39   Component     Latest Ref Rng 07/09/2022  Color, Urine     YELLOW  YELLOW   Appearance     CLEAR  CLEAR   Specific Gravity, Urine     1.005 - 1.030  1.020   pH     5.0 - 8.0  6.0   Glucose, UA     NEGATIVE mg/dL NEGATIVE   Hgb urine dipstick     NEGATIVE  TRACE !   Bilirubin Urine     NEGATIVE  NEGATIVE   Ketones,  ur     NEGATIVE mg/dL TRACE !   Protein     NEGATIVE mg/dL TRACE !   Nitrite     NEGATIVE  NEGATIVE   Squamous Epithelial / LPF     0 - 5  0-5   WBC, UA     0 - 5 WBC/hpf 0-5   RBC / HPF     0 - 5 RBC/hpf 6-10   Bacteria, UA     NONE SEEN  FEW !   Mucus PRESENT   Leukocytes,Ua     NEGATIVE  NEGATIVE     Legend: ! Abnormal I have reviewed the labs.  Assessment & Plan:    1. Phimosis -resolved    2. BPH with LUTS -PSA normal -DRE benign  -UA benign w/ micro heme - see #4 -continue conservative management, avoiding bladder irritants and timed voiding's   3. Prostate cancer screening -up to date -continue  annual screening due to risk factors of family history of prostate cancer and African ancestry  4. High risk hematuria -non-smoker -work up 2019 - friable prostate -had micro heme again in 07/2021 - RUS (07/2021) - small cyst - patient refused repeat cysto  -no reports of gross hematuria -UA 6-10 RBC's -discussed AUA guidelines for [facility] and w/SDM he would like to repeat the hematuria work up as it has been since 2019 since his last work up   5. Erectile dysfunction - encouraged patient to continue w/ CPAP -he is not taking medications w/ nitrates and has no exercise restrictions -trial of Sildenafil 20 mg, 1 to 5 tablets two hours prior to intercourse on an empty stomach, # 61; he is warned not to take medications that contain nitrates.  I also advised him of the side effects, such as: headache, flushing, dyspepsia, abnormal vision, nasal congestion, back pain, myalgia, nausea, dizziness, and rash.  -sild[facility]il script [facility] to [facility] so he can use Good Rx coupon   Return for CT report and cysto w/ Dr. Erlene Quan for micro heme .  These notes generated with voice recognition software. I apologize for typographical errors.  [place], [place] [address] [place], [place] [address] [phone]

## 2022-07-09 ENCOUNTER — Encounter: Payer: Self-pay | Admitting: Urology

## 2022-07-09 ENCOUNTER — Other Ambulatory Visit
Admission: RE | Admit: 2022-07-09 | Discharge: 2022-07-09 | Disposition: A | Discharge status: Home / Self Care | Payer: BC Managed Care – PPO | Attending: Urology | Admitting: Urology

## 2022-07-09 ENCOUNTER — Ambulatory Visit (INDEPENDENT_AMBULATORY_CARE_PROVIDER_SITE_OTHER): Payer: BC Managed Care – PPO | Admitting: Urology

## 2022-07-09 VITALS — BP 137/75 | HR 87 | Ht 73.0 in | Wt 352.0 lb

## 2022-07-09 DIAGNOSIS — N471 Phimosis: Secondary | ICD-10-CM

## 2022-07-09 DIAGNOSIS — N529 Male erectile dysfunction, unspecified: Secondary | ICD-10-CM

## 2022-07-09 DIAGNOSIS — N401 Enlarged prostate with lower urinary tract symptoms: Secondary | ICD-10-CM

## 2022-07-09 DIAGNOSIS — R3129 Other microscopic hematuria: Secondary | ICD-10-CM

## 2022-07-09 DIAGNOSIS — Z8042 Family history of malignant neoplasm of prostate: Secondary | ICD-10-CM | POA: Diagnosis not present

## 2022-07-09 DIAGNOSIS — N138 Other obstructive and reflux uropathy: Secondary | ICD-10-CM

## 2022-07-09 DIAGNOSIS — R319 Hematuria, unspecified: Secondary | ICD-10-CM

## 2022-07-09 DIAGNOSIS — Z125 Encounter for screening for malignant neoplasm of prostate: Secondary | ICD-10-CM

## 2022-07-09 LAB — URINALYSIS, COMPLETE (UACMP) WITH MICROSCOPIC
Bilirubin Urine: NEGATIVE
Glucose, UA: NEGATIVE mg/dL
Leukocytes,Ua: NEGATIVE
Nitrite: NEGATIVE
Specific Gravity, Urine: 1.02 (ref 1.005–1.030)
pH: 6 (ref 5.0–8.0)

## 2022-07-09 MED ORDER — SILDENAFIL CITRATE 20 MG PO TABS: ORAL_TABLET | ORAL | 3 refills | Status: DC

## 2022-07-23 ENCOUNTER — Ambulatory Visit: Admission: RE | Admit: 2022-07-23 | Payer: BC Managed Care – PPO | Source: Ambulatory Visit

## 2022-07-27 ENCOUNTER — Other Ambulatory Visit: Payer: BC Managed Care – PPO | Admitting: Urology

## 2022-07-27 ENCOUNTER — Ambulatory Visit: Payer: BC Managed Care – PPO | Admitting: Urology

## 2022-07-27 ENCOUNTER — Encounter: Payer: Self-pay | Admitting: Urology

## 2022-07-27 VITALS — BP 161/77 | HR 82 | Ht 73.0 in | Wt 356.0 lb

## 2022-07-27 DIAGNOSIS — R3129 Other microscopic hematuria: Secondary | ICD-10-CM | POA: Diagnosis not present

## 2022-07-27 NOTE — Progress Notes (Signed)
   07/27/22  CC:  Chief Complaint  Patient presents with   Cysto    HPI: 56 year old male with high risk hematuria who presents today for cystoscopy.  CT urogram has been ordered and is scheduled for next week.  Please see previous notes for details.  NED. A&Ox3.   No respiratory distress   Abd soft, NT, ND Normal phallus with bilateral descended testicles  Cystoscopy Procedure Note  Patient identification was confirmed, informed consent was obtained, and patient was prepped using Betadine solution.  Lidocaine jelly was administered per urethral meatus.     Pre-Procedure: - Inspection reveals a normal caliber ureteral meatus.  Procedure: The flexible cystoscope was introduced without difficulty - No urethral strictures/lesions are present. - Enlarged prostate bilobar coaptation/friable prostatic vessels - Normal bladder neck - Bilateral ureteral orifices identified - Bladder mucosa  reveals no ulcers, tumors, or lesions - No bladder stones - No trabeculation  Retroflexion shows unremarkable   Post-Procedure: - Patient tolerated the procedure well  Assessment/ Plan:  1. Microscopic hematuria Cystoscopy today is unremarkable other than friable prostatic vessels, possibly the source  CT scan ordered, scheduled for next week, we will plan to call him with any concerns or abnormal findings    Return in about 1 year (around 07/28/2023) for [facility] IPSS/ PVR/ PSA/ DRE/ UA.  Hollice Espy, MD

## 2022-07-30 ENCOUNTER — Ambulatory Visit
Admission: RE | Admit: 2022-07-30 | Discharge: 2022-07-30 | Disposition: A | Discharge status: Home / Self Care | Payer: BC Managed Care – PPO | Source: Ambulatory Visit | Attending: Urology | Admitting: Urology

## 2022-07-30 DIAGNOSIS — R3129 Other microscopic hematuria: Secondary | ICD-10-CM | POA: Diagnosis present

## 2022-07-30 MED ORDER — IOHEXOL 300 MG/ML  SOLN
125.0000 mL | Freq: Once | INTRAMUSCULAR | Status: AC | PRN
  Administered 2022-07-30: 125 mL via INTRAVENOUS

## 2023-01-15 IMAGING — US US RENAL
2 series · 14 of 25 positions shown · non-contrast
Comparison: CT 02/03/2018

CLINICAL DATA: Microscopic hematuria

EXAM:
RENAL / URINARY TRACT ULTRASOUND COMPLETE

[Series 1: us renal · 0.28mm/px · 13 of 29 slices shown (1 of 2)]
[im 1/29]
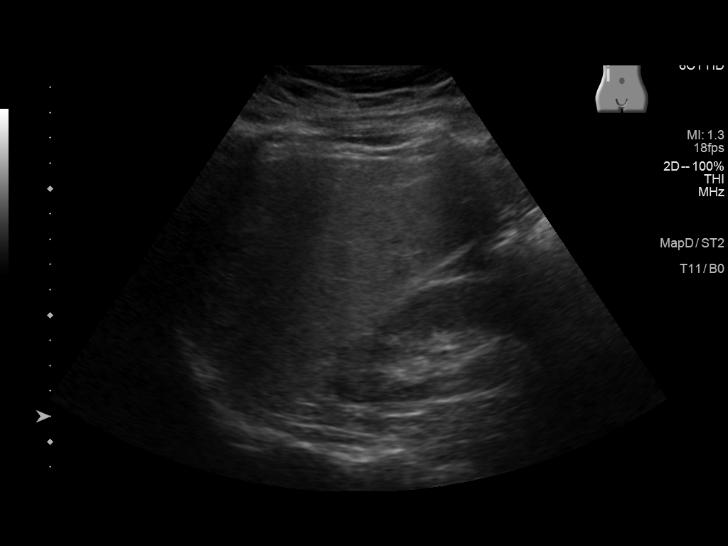
[im 3/29]
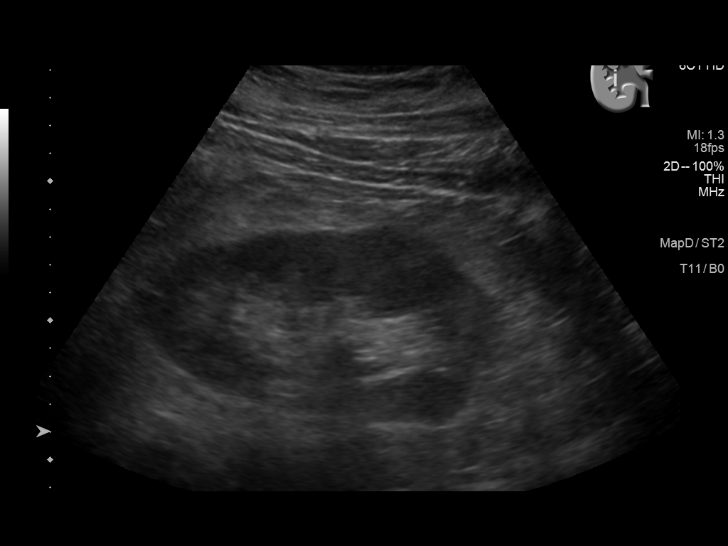
[im 6/29]
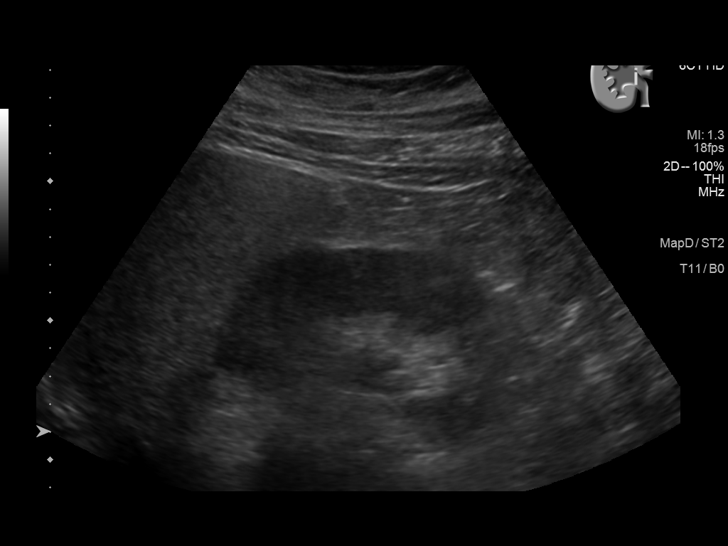
[im 8/29]
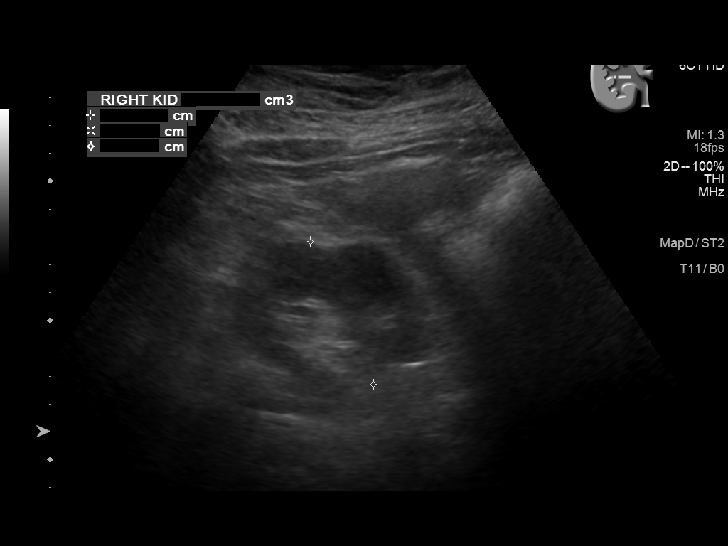
[im 11/29]
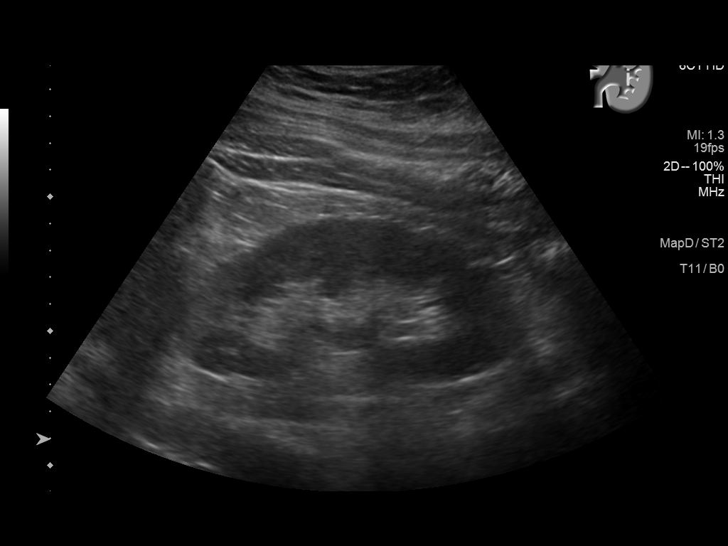
[im 12/29]
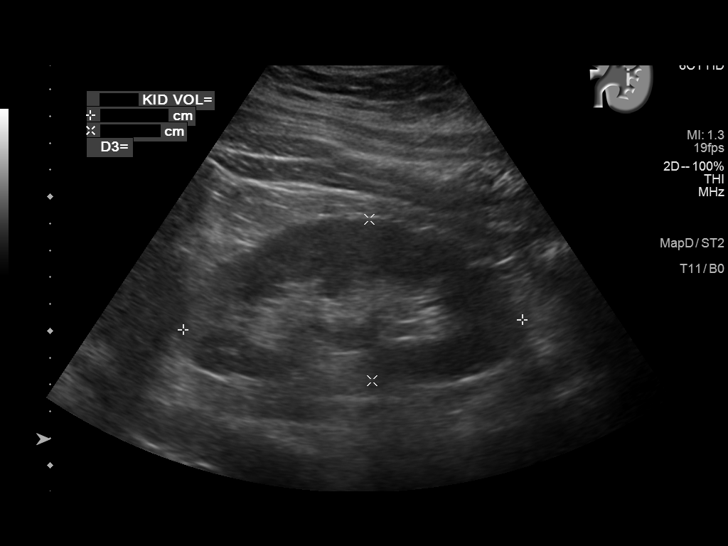
[im 15/29]
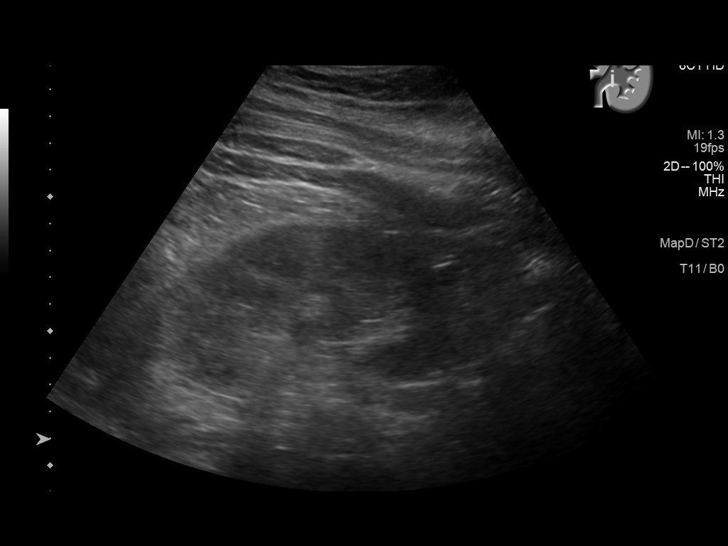
[im 17/29]
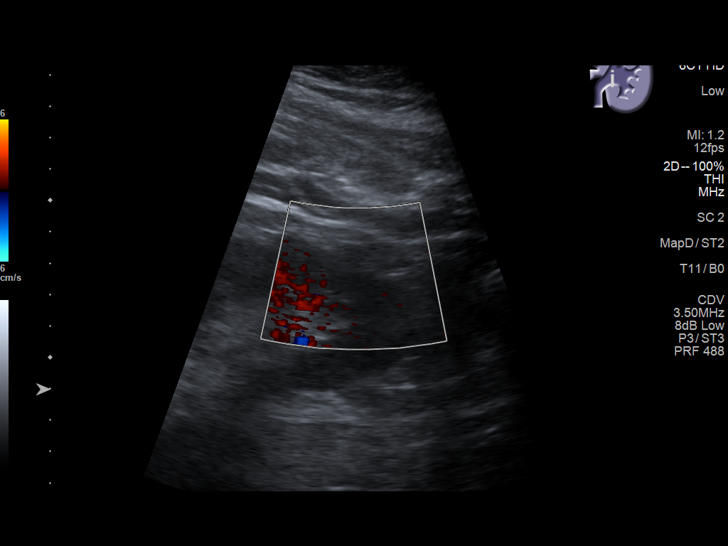
[im 20/29]
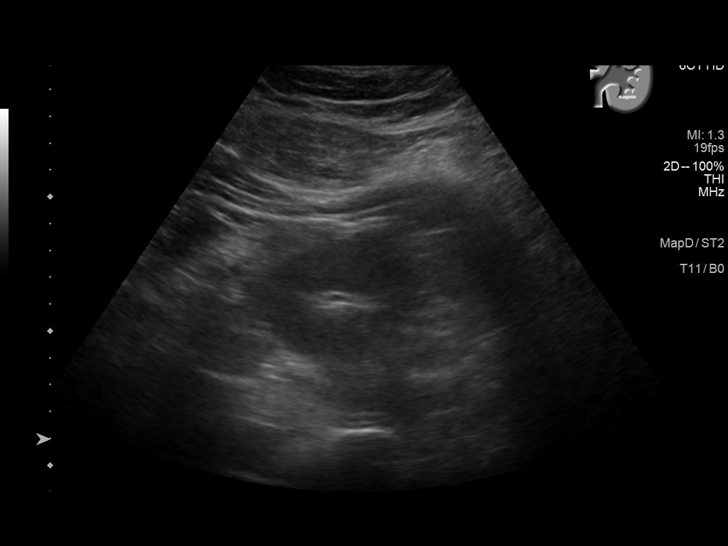
[im 21/29]
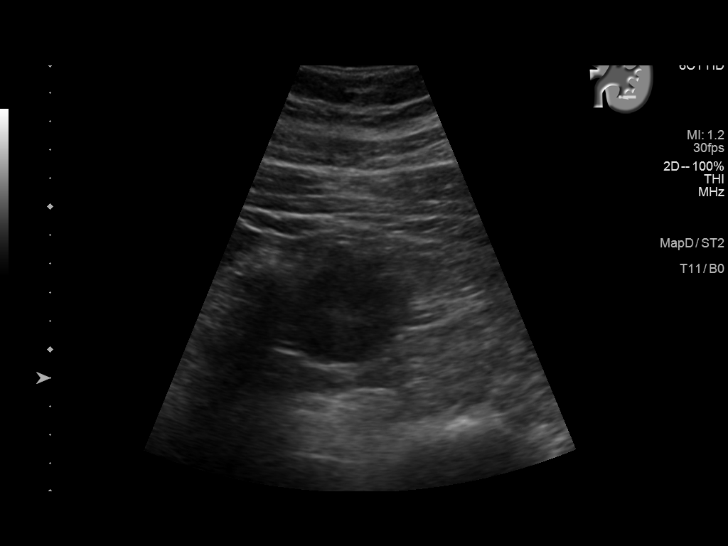
[im 23/29]
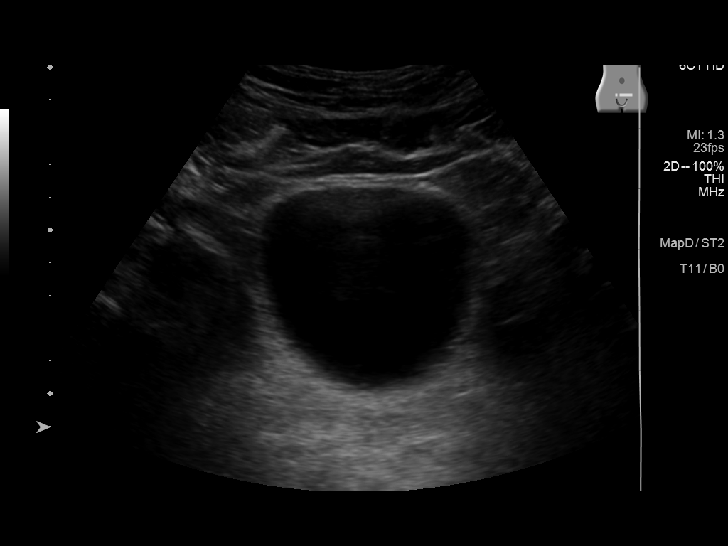
[im 26/29]
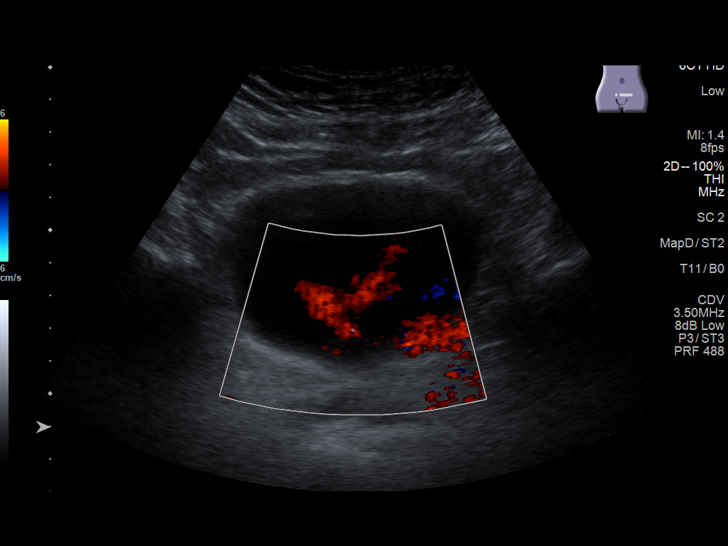
[im 29/29]
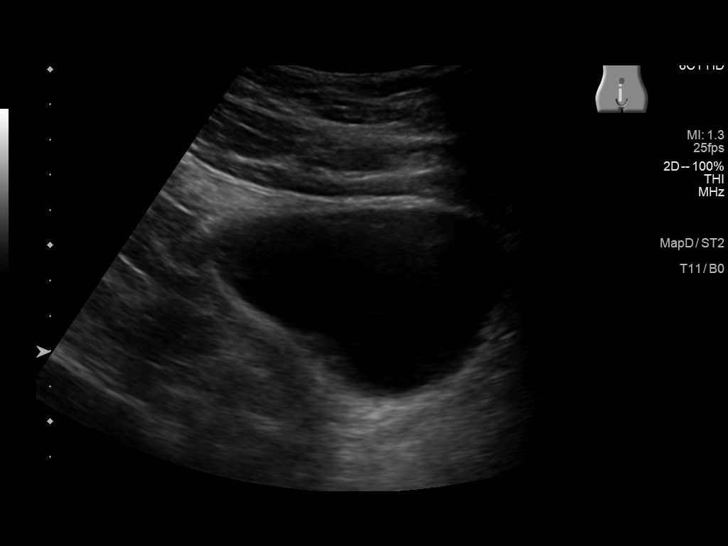

[Series 2001: us renal · 0.25mm/px · 1 of 2 slices shown (2 of 2)]
[im 2/2]
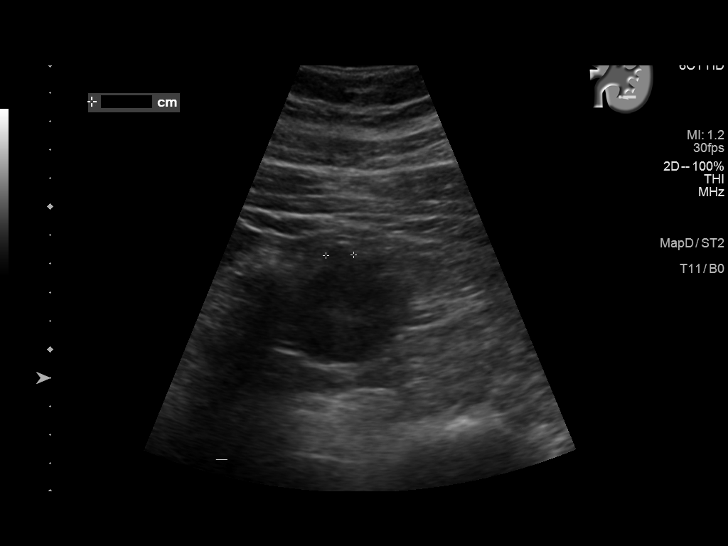

[14 of 25 positions shown; findings below may reference images not displayed]

FINDINGS: Right Kidney:

Renal measurements: 12.6 x 7.1 x 5.6 cm = volume: 260 mL.
Echogenicity within normal limits. No mass or hydronephrosis
visualized.

Left Kidney:

Renal measurements: 12.6 x 6 x 6.4 cm = volume: 255 mL. Echogenicity
within normal limits. No hydronephrosis. Tiny exophytic cyst off the
lower pole measuring 12 mm

Bladder:

Appears normal for degree of bladder distention.

Other:

None.
IMPRESSION: Negative for hydronephrosis or shadowing stone. Tiny left renal cyst

## 2023-07-29 ENCOUNTER — Ambulatory Visit: Payer: Managed Care, Other (non HMO) | Admitting: Urology

## 2023-08-14 ENCOUNTER — Other Ambulatory Visit: Payer: Self-pay | Admitting: Urology

## 2023-08-14 DIAGNOSIS — R3129 Other microscopic hematuria: Secondary | ICD-10-CM

## 2023-08-14 NOTE — Progress Notes (Signed)
08/19/2023 9:05 AM   Jerry Conley May 14, 1966 [id number]  Referring provider: Marina Goodell, MD [address] [place],  [place] [address]  Urological history: 1. Phimosis - managed with betamethasone dipropionate bid   2. BPH with LU TS - cysto (07/2023) -enlarged prostate bilobar coaptation/friable prostatic vessels  3. Nocturia -Risk factors for nocturia: obstructive sleep apnea (on CPAP) and BPH.    4. Prostate cancer screening -PSA (06/2023) 0.58 - AUA guidelines recommend prostate cancer screening for men 63 to 56 years of age at leasevery two to four years - African ancestry  - father and paternal grandfather with prostate cancer  5. High risk hematuria - non-smoker - CTU and cysto 2019 - mildly enlarged prostate with bilobar coaptation, friable prostatic vessels appreciated - RUS (07/2021) - 12 mm left renal cyst - CTU (07/2022) - NED - cysto (07/2022) - friable prostate   6. Phimosis -resolved   HPI: Jerry Conley. is a 57 y.o. male who presents today for yearly follow up.   Previous records reviewed.    I PSS 6/2  UA unremarkable  He has no urinary complaints.  Patient denies any modifying or aggravating factors.  Patient denies any recent UTI's, gross hematuria, dysuria or suprapubic/flank pain.  Patient denies any fevers, chills, nausea or vomiting.     IPSS     Row Name 08/19/23 0800         International Prostate Symptom Score   How often have you had the sensation of not emptying your bladder? Less than 1 in 5     How often have you had to urinate less than every two hours? Less than 1 in 5 times     How often have you found you stopped and started again several times when you urinated? Less than 1 in 5 times     How often have you found it difficult to postpone urination? Less than 1 in 5 times     How often have you had a weak urinary stream? Less than 1 in 5 times     How often have you had to strain to start urination? Not at All      How many times did you typically get up at night to urinate? 1 Time     Total IPSS Score 6       Quality of Life due to urinary symptoms   If you were to spend the rest of your life with your urinary condition just the way it is now how would you feel about that? Mostly Satisfied                Score:  1-7 Mild 8-19 Moderate 20-35 Severe    SHIM 15   He is still hesitant to take the sildenafil.  He is nervous about what it can do with his heart.  Patient still having spontaneous erections.  He denies any pain or curvature with erections.     SHIM     Row Name 08/19/23 0853         SHIM: Over the last 6 months:   How do you rate your confidence that you could get and keep an erection? Moderate     When you had erections with sexual stimulation, how often were your erections hard enough for penetration (entering your partner)? Sometimes (about half the time)     During sexual intercourse, how often were you able to maintain your erection after you  had penetrated (entered) your partner? Sometimes (about half the time)     During sexual intercourse, how difficult was it to maintain your erection to completion of intercourse? Difficult     When you attempted sexual intercourse, how often was it satisfactory for you? Sometimes (about half the time)       SHIM Total Score   SHIM 15               Score: 1-7 Severe ED 8-11 Moderate ED 12-16 Mild-Moderate ED 17-21 Mild ED 22-25 No ED   PMH: Past Medical History:  Diagnosis Date   Hypertension    Obesity    Sleep apnea     Surgical History: Past Surgical History:  Procedure Laterality Date   COLONOSCOPY WITH PROPOFOL N/A 06/04/2018   Procedure: COLONOSCOPY WITH PROPOFOL;  Surgeon: Scot Jun, MD;  Location: [facility] ENDOSCOPY;  Service: Endoscopy;  Laterality: N/A;   THROAT SURGERY      Home Medications:  Allergies as of 08/19/2023   No Known Allergies      Medication List        Accurate as  of August 19, 2023  9:05 AM. If you have any questions, ask your nurse or doctor.          betamethasone dipropionate 0.05 % cream Apply topically 2 (two) times daily.   ONE-A-DAY SCOOBY-DOO GUMMIES PO Take by mouth.   sildenafil 20 MG tablet Commonly known as: REVATIO Take 1 to 5 tablets two hours before intercouse on an empty stomach.  Do not take with nitrates.        Allergies: No Known Allergies  Family History: Family History  Problem Relation Age of Onset   Prostate cancer Father    Kidney cancer Neg Hx    Bladder Cancer Neg Hx     Social History:  reports that he has never smoked. He has never used smokeless tobacco. He reports current alcohol use. He reports that he does not use drugs.  ROS: For pertinent review of systems please refer to history of present illness  Physical Exam: BP 139/77 (BP Location: Left Arm, Patient Position: Sitting, Cuff Size: Large)   Pulse (!) 101   Ht 6\' 1"  (1.854 m)   Wt (!) 355 lb 6.4 oz (161.2 kg)   BMI 46.89 kg/m   Constitutional:  Well nourished. Alert and oriented, No acute distress. HEENT: [place] AT, moist mucus membranes.  Trachea midline Cardiovascular: No clubbing, cyanosis, or edema. Respiratory: Normal respiratory effort, no increased work of breathing. GU: No CVA tenderness.  No bladder fullness or masses.  Patient with uncircumcised phallus. Foreskin easily retracted.  Vitiligo present.  Urethral meatus is patent.  No penile discharge. No penile lesions or rashes. Scrotum without lesions, cysts, rashes and/or edema.  Testicles are located scrotally bilaterally. No masses are appreciated in the testicles. Left and right epididymis are normal. Rectal: Patient with  normal sphincter tone. Anus and perineum without scarring or rashes. No rectal masses are appreciated. Prostate is approximately 50 grams, could not palpate the entire gland, no nodules are appreciated. Seminal vesicles could not be palpated Skin: No rashes,  bruises or suspicious lesions. Lymph: No cervical or inguinal adenopathy. Neurologic: Grossly intact, no focal deficits, moving all 4 extremities. Psychiatric: Normal mood and affect.   Laboratory Data: CBC w/auto Differential (3 Part) Order: [id number] Component Ref Range & Units 1 mo ago  WBC (White Blood Cell Count) 4.1 - 10.2 10^3/uL 4.1  RBC (Red Blood Cell  Count) 4.69 - 6.13 10^6/uL 5.01  Hemoglobin 14.1 - 18.1 gm/dL 60.4 Low   Hematocrit 54.0 - 52.0 % 40.1  MCV (Mean Corpuscular Volume) 80.0 - 100.0 fl 80  MCH (Mean Corpuscular Hemoglobin) 27.0 - 31.2 pg 25.3 Low   MCHC (Mean Corpuscular Hemoglobin Concentration) 32.0 - 36.0 gm/dL 98.1 Low   Platelet Count 150 - 450 10^3/uL 246  RDW-CV (Red Cell Distribution Width) 11.6 - 14.8 % 14.3  MPV (Mean Platelet Volume) 9.4 - 12.4 fl 9.4  Neutrophils 1.50 - 7.80 10^3/uL 1.9  Lymphocytes 1.00 - 3.60 10^3/uL 1.9  Mixed Count 0.10 - 0.90 10^3/uL 0.3  Neutrophil % 32.0 - 70.0 % 44.9  Lymphocyte % 10.0 - 50.0 % 46.8  Mixed % 3.0 - 14.4 % 8.3  Resulting Agency [facility]   Specimen Collected: 06/21/23 09:09   Performed by: [facility] Last Resulted: 06/21/23 09:35  Received From: [facility]  Result Received: 07/24/23 15:25    Comprehensive Metabolic Panel (CMP) Order: [id number] Component Ref Range & Units 1 mo ago  Glucose 70 - 110 mg/dL 89  Sodium 621 - 308 mmol/L 140  Potassium 3.6 - 5.1 mmol/L 4.5  Chloride 97 - 109 mmol/L 104  Carbon Dioxide (CO2) 22.0 - 32.0 mmol/L 29.5  Urea Nitrogen (BUN) 7 - 25 mg/dL 12  Creatinine 0.7 - 1.3 mg/dL 0.9  Glomerular Filtration Rate (eGFR) >60 mL/min/1.73sq m 100  Comment: CKD-EPI (2021) does not include patient's race in the calculation of eGFR.  Monitoring changes of plasma creatinine and eGFR over time is useful for monitoring kidney function.  Interpretive Ranges for eGFR (CKD-EPI 2021):  eGFR:       >60  mL/min/1.73 sq. m - Normal eGFR:       30-59 mL/min/1.73 sq. m - Moderately Decreased eGFR:       15-29 mL/min/1.73 sq. m  - Severely Decreased eGFR:       < 15 mL/min/1.73 sq. m  - Kidney Failure   Note: These eGFR calculations do not apply in acute situations when eGFR is changing rapidly or patients on dialysis.  Calcium 8.7 - 10.3 mg/dL 9.3  AST 8 - 39 U/L 28  ALT 6 - 57 U/L 35  Alk Phos (alkaline Phosphatase) 34 - 104 U/L 64  Albumin 3.5 - 4.8 g/dL 4.1  Bilirubin, Total 0.3 - 1.2 mg/dL 0.5  Protein, Total 6.1 - 7.9 g/dL 6.8  A/G Ratio 1.0 - 5.0 gm/dL 1.5  Resulting Agency [facility]   Specimen Collected: 06/21/23 09:09   Performed by: [facility] Last Resulted: 06/21/23 14:14  Received From: [facility]  Result Received: 07/24/23 15:25   Lipid Panel w/calc LDL Order: [id number] Component Ref Range & Units 1 mo ago  Cholesterol, Total 100 - 200 mg/dL 952  Triglyceride 35 - 199 mg/dL 841  HDL (High Density Lipoprotein) Cholesterol 29.0 - 71.0 mg/dL 32.4  LDL Calculated 0 - 130 mg/dL 73  VLDL Cholesterol mg/dL 25  Cholesterol/HDL Ratio 3.5  Resulting Agency [facility]   Specimen Collected: 06/21/23 09:09   Performed by: [facility] Last Resulted: 06/21/23 14:14  Received From: [facility]  Result Received: 07/24/23 15:25   PSA, Total (Screen) Order: [id number] Component Ref Range & Units 1 mo ago  PSA (Prostate Specific Antigen), Total 0.10 - 4.00 ng/mL 0.58  Resulting Agency [facility] -  LAB  Narrative Performed by [facility] - [facility] Test results were determined with [facility] Hybritech Assay. Values obtained with different assay methods cannot be used interchangeably in serial testing. Assay results should not be interpreted as absolute evidence of the presence or absence of malignant disease  Specimen Collected: 06/21/23 09:09    Performed by: [facility] Last Resulted: 06/21/23 13:15  Received From: [facility]  Result Received: 07/24/23 15:25    Thyroid Stimulating-Hormone (TSH) Order: [id number] Component Ref Range & Units 1 mo ago  Thyroid Stimulating Hormone (TSH) 0.450-5.330 uIU/ml uIU/mL 1.535  Resulting Agency [facility] - [facility]   Specimen Collected: 06/21/23 09:09   Performed by: [facility] Last Resulted: 06/21/23 13:15  Received From: [facility]  Result Received: 07/24/23 15:25  I have reviewed the labs.  Assessment & Plan:    1. Phimosis -resolved    2. BPH with LUTS -PSA normal -DRE benign  -UA benign -continue conservative management, avoiding bladder irritants and timed voiding's   3. Prostate cancer screening -up to date -continue annual screening due to risk factors of family history of prostate cancer and African ancestry  4. High risk hematuria -non-smoker -work up 2019 - friable prostate -had micro heme again in 07/2021 - RUS (07/2021) - small cyst - patient refused repeat cysto  -no reports of gross hematuria -work up 2023 - friable prostate -UA negative for micro heme  5. Erectile dysfunction -He is nervous about taking p.o. medications for his ED, we did discuss ICI, but he deferred  Return in about 1 year (around 08/18/2024) for UA, SHIM, I PSS .  These notes generated with voice recognition software. I apologize for typographical errors.  Cloretta Ned  [facility] [address] [place], [place] [address] [phone]

## 2023-08-19 ENCOUNTER — Ambulatory Visit (INDEPENDENT_AMBULATORY_CARE_PROVIDER_SITE_OTHER): Payer: Managed Care, Other (non HMO) | Admitting: Urology

## 2023-08-19 ENCOUNTER — Encounter: Payer: Self-pay | Admitting: Urology

## 2023-08-19 ENCOUNTER — Other Ambulatory Visit
Admission: RE | Admit: 2023-08-19 | Discharge: 2023-08-19 | Disposition: A | Discharge status: Home / Self Care | Payer: Managed Care, Other (non HMO) | Attending: Urology | Admitting: Urology

## 2023-08-19 VITALS — BP 139/77 | HR 101 | Ht 73.0 in | Wt 355.4 lb

## 2023-08-19 DIAGNOSIS — N471 Phimosis: Secondary | ICD-10-CM

## 2023-08-19 DIAGNOSIS — R3129 Other microscopic hematuria: Secondary | ICD-10-CM | POA: Diagnosis present

## 2023-08-19 DIAGNOSIS — N401 Enlarged prostate with lower urinary tract symptoms: Secondary | ICD-10-CM

## 2023-08-19 DIAGNOSIS — R319 Hematuria, unspecified: Secondary | ICD-10-CM

## 2023-08-19 DIAGNOSIS — N138 Other obstructive and reflux uropathy: Secondary | ICD-10-CM

## 2023-08-19 DIAGNOSIS — N529 Male erectile dysfunction, unspecified: Secondary | ICD-10-CM | POA: Diagnosis not present

## 2023-08-19 LAB — URINALYSIS, COMPLETE (UACMP) WITH MICROSCOPIC
Glucose, UA: NEGATIVE mg/dL
Hgb urine dipstick: NEGATIVE
Leukocytes,Ua: NEGATIVE
Nitrite: NEGATIVE
Specific Gravity, Urine: 1.02 (ref 1.005–1.030)
pH: 7 (ref 5.0–8.0)

## 2023-08-19 MED ORDER — BETAMETHASONE DIPROPIONATE 0.05 % EX CREA
TOPICAL_CREAM | Freq: Two times a day (BID) | CUTANEOUS | 2 refills | Status: DC
Start: 2023-08-19 — End: 2024-08-31

## 2023-11-19 ENCOUNTER — Encounter: Payer: Self-pay | Admitting: Internal Medicine

## 2023-11-25 ENCOUNTER — Encounter: Payer: Self-pay | Admitting: Internal Medicine

## 2023-11-26 ENCOUNTER — Encounter
Admission: RE | Disposition: A | Discharge status: Home / Self Care | Payer: Self-pay | Source: Home / Self Care | Attending: Internal Medicine

## 2023-11-26 ENCOUNTER — Ambulatory Visit
Admission: RE | Admit: 2023-11-26 | Discharge: 2023-11-26 | Disposition: A | Discharge status: Home / Self Care | Payer: Managed Care, Other (non HMO) | Attending: Internal Medicine | Admitting: Internal Medicine

## 2023-11-26 ENCOUNTER — Other Ambulatory Visit: Payer: Self-pay

## 2023-11-26 ENCOUNTER — Encounter: Payer: Self-pay | Admitting: Internal Medicine

## 2023-11-26 ENCOUNTER — Ambulatory Visit: Payer: Managed Care, Other (non HMO) | Admitting: Certified Registered Nurse Anesthetist

## 2023-11-26 DIAGNOSIS — Z1211 Encounter for screening for malignant neoplasm of colon: Secondary | ICD-10-CM | POA: Diagnosis present

## 2023-11-26 DIAGNOSIS — G4730 Sleep apnea, unspecified: Secondary | ICD-10-CM | POA: Diagnosis not present

## 2023-11-26 DIAGNOSIS — K5900 Constipation, unspecified: Secondary | ICD-10-CM | POA: Insufficient documentation

## 2023-11-26 DIAGNOSIS — I10 Essential (primary) hypertension: Secondary | ICD-10-CM | POA: Diagnosis not present

## 2023-11-26 DIAGNOSIS — K219 Gastro-esophageal reflux disease without esophagitis: Secondary | ICD-10-CM | POA: Insufficient documentation

## 2023-11-26 HISTORY — PX: COLONOSCOPY WITH PROPOFOL: SHX5780

## 2023-11-26 SURGERY — COLONOSCOPY WITH PROPOFOL
Anesthesia: General

## 2023-11-26 MED ORDER — SODIUM CHLORIDE 0.9 % IV SOLN: INTRAVENOUS | Status: DC

## 2023-11-26 MED ORDER — PROPOFOL 10 MG/ML IV BOLUS
INTRAVENOUS | Status: DC | PRN
  Administered 2023-11-26 (×2): 20 mg via INTRAVENOUS
  Administered 2023-11-26: 60 mg via INTRAVENOUS

## 2023-11-26 MED ORDER — PROPOFOL 500 MG/50ML IV EMUL
INTRAVENOUS | Status: DC | PRN
  Administered 2023-11-26: 150 ug/kg/min via INTRAVENOUS

## 2023-11-26 MED ORDER — DEXMEDETOMIDINE HCL IN NACL 80 MCG/20ML IV SOLN
INTRAVENOUS | Status: DC | PRN
  Administered 2023-11-26: 12 ug via INTRAVENOUS

## 2023-11-26 NOTE — Transfer of Care (Signed)
Immediate Anesthesia Transfer of Care Note  Patient: Jerry Conley.  Procedure(s) Performed: COLONOSCOPY WITH PROPOFOL  Patient Location: PACU  Anesthesia Type:General  Level of Consciousness: awake and alert   Airway & Oxygen Therapy: Patient Spontanous Breathing  Post-op Assessment: Report given to RN and Post -op Vital signs reviewed and stable  Post vital signs: Reviewed and stable  Last Vitals:  Vitals Value Taken Time  BP 130/76   Temp    Pulse 103 11/26/23 1109  Resp 17 11/26/23 1109  SpO2 100 % 11/26/23 1109  Vitals shown include unfiled device data.  Last Pain:  Vitals:   11/26/23 1009  TempSrc: Temporal  PainSc: 0-No pain         Complications: No notable events documented.

## 2023-11-26 NOTE — Interval H&P Note (Signed)
History and Physical Interval Note:  11/26/2023 9:59 AM  Jerry Conley.  has presented today for surgery, with the diagnosis of Z86.0100 (ICD-10-CM) - History of colon polyps.  The various methods of treatment have been discussed with the patient and family. After consideration of risks, benefits and other options for treatment, the patient has consented to  Procedure(s): COLONOSCOPY WITH PROPOFOL (N/A) as a surgical intervention.  The patient's history has been reviewed, patient examined, no change in status, stable for surgery.  I have reviewed the patient's chart and labs.  Questions were answered to the patient's satisfaction.     [place], [place]

## 2023-11-26 NOTE — Anesthesia Procedure Notes (Signed)
Date/Time: 11/26/2023 10:38 AM  Performed by: Malva Cogan, CRNAPre-anesthesia Checklist: Patient identified, Emergency Drugs available, Suction available, Patient being monitored and Timeout performed Patient Re-evaluated:Patient Re-evaluated prior to induction Oxygen Delivery Method: Simple face mask Induction Type: IV induction Placement Confirmation: CO2 detector and positive ETCO2

## 2023-11-26 NOTE — H&P (Signed)
Outpatient short stay form Pre-procedure 11/26/2023 9:58 AM Ivanell Deshotel K. Norma Fredrickson, M.D.  Primary Physician: Maudie Flakes, M.D.  Reason for visit:  Personal history of colon polyps  History of present illness:  Mr. Mangano reports overall doing well from a GI standpoint. He he does endorse some mild issues with constipation. He reports generally having a bowel movement about every 2 to 3 days. He feels he used to have a bowel movement daily but he now has stools less often. He denies any rectal bleeding, abdominal pain, straining with bowel movements. No alternating diarrhea.  She has some GERD symptoms in the past, he took omeprazole for about a month as recommended by PCP. He is now only getting the GERD symptoms if he eats a trigger food. He does not currently need PPI frequently. He denies any nausea vomiting or dysphagia. No epigastric pain.  No frequent tobacco alcohol NSAIDs.  COLONOSCOPY 05/15/2018  Adenomatous Polyp: CBF 05/2023 (05/07/2023 Recall letter returned.[email or web address])      Current Facility-Administered Medications:    0.9 %  sodium chloride infusion, , Intravenous, Continuous, Mariaha Ellington, Boykin Nearing, MD  Medications Prior to Admission  Medication Sig Dispense Refill Last Dose/Taking   betamethasone dipropionate 0.05 % cream Apply topically 2 (two) times daily. 30 g 2    Pediatric Multivit-Minerals-C (ONE-A-DAY SCOOBY-DOO GUMMIES PO) Take by mouth.      sildenafil (REVATIO) 20 MG tablet Take 1 to 5 tablets two hours before intercouse on an empty stomach.  Do not take with nitrates. 30 tablet 3      No Known Allergies   Past Medical History:  Diagnosis Date   Hypertension    Obesity    Sleep apnea     Review of systems:  Otherwise negative.    Physical Exam  Gen: Alert, oriented. Appears stated age.  HEENT: [place]/AT. PERRLA. Lungs: CTA, no wheezes. CV: RR nl S1, S2. Abd: soft, benign, no masses. BS+ Ext: No edema. Pulses 2+    Planned procedures: Proceed with  colonoscopy. The patient understands the nature of the planned procedure, indications, risks, alternatives and potential complications including but not limited to bleeding, infection, perforation, damage to internal organs and possible oversedation/side effects from anesthesia. The patient agrees and gives consent to proceed.  Please refer to procedure notes for findings, recommendations and patient disposition/instructions.     Kadyn Guild K. Norma Fredrickson, M.D. Gastroenterology 11/26/2023  9:58 AM

## 2023-11-26 NOTE — Anesthesia Postprocedure Evaluation (Signed)
Anesthesia Post Note  Patient: Jerry Conley.  Procedure(s) Performed: COLONOSCOPY WITH PROPOFOL  Patient location during evaluation: Endoscopy Anesthesia Type: General Level of consciousness: awake and alert Pain management: pain level controlled Vital Signs Assessment: post-procedure vital signs reviewed and stable Respiratory status: spontaneous breathing, nonlabored ventilation, respiratory function stable and patient connected to nasal cannula oxygen Cardiovascular status: blood pressure returned to baseline and stable Postop Assessment: no apparent nausea or vomiting Anesthetic complications: no   No notable events documented.   Last Vitals:  Vitals:   11/26/23 1120 11/26/23 1130  BP: 134/87 (!) 148/80  Pulse: 94 82  Resp: 19 16  Temp:    SpO2: 100% 100%    Last Pain:  Vitals:   11/26/23 1130  TempSrc:   PainSc: 0-No pain                 Lenard Simmer

## 2023-11-26 NOTE — Anesthesia Preprocedure Evaluation (Signed)
Anesthesia Evaluation  Patient identified by MRN, date of birth, ID band Patient awake    Reviewed: Allergy & Precautions, H&P , NPO status , Patient's Chart, lab work & pertinent test results, reviewed documented beta blocker date and time   History of Anesthesia Complications Negative for: history of anesthetic complications  Airway Mallampati: I  TM Distance: >3 FB Neck ROM: full    Dental  (+) Dental Advidsory Given, Teeth Intact   Pulmonary neg shortness of breath, sleep apnea and Continuous Positive Airway Pressure Ventilation , neg COPD, neg recent URI   Pulmonary exam normal breath sounds clear to auscultation       Cardiovascular Exercise Tolerance: Good hypertension, (-) angina (-) Past MI and (-) Cardiac Stents Normal cardiovascular exam(-) dysrhythmias (-) Valvular Problems/Murmurs Rhythm:regular Rate:Normal     Neuro/Psych negative neurological ROS  negative psych ROS   GI/Hepatic Neg liver ROS,GERD  ,,  Endo/Other  neg diabetes  Class 3 obesity  Renal/GU negative Renal ROS  negative genitourinary   Musculoskeletal   Abdominal   Peds  Hematology negative hematology ROS (+)   Anesthesia Other Findings Past Medical History: No date: Hypertension No date: Obesity No date: Sleep apnea   Reproductive/Obstetrics negative OB ROS                             Anesthesia Physical Anesthesia Plan  ASA: 3  Anesthesia Plan: General   Post-op Pain Management:    Induction: Intravenous  PONV Risk Score and Plan: 3 and Propofol infusion, TIVA and Treatment may vary due to age or medical condition  Airway Management Planned: Natural Airway and Nasal Cannula  Additional Equipment:   Intra-op Plan:   Post-operative Plan:   Informed Consent: I have reviewed the patients History and Physical, chart, labs and discussed the procedure including the risks, benefits and alternatives  for the proposed anesthesia with the patient or authorized representative who has indicated his/her understanding and acceptance.     Dental Advisory Given  Plan Discussed with: Anesthesiologist, CRNA and Surgeon  Anesthesia Plan Comments:        Anesthesia Quick Evaluation

## 2023-11-26 NOTE — Op Note (Signed)
[facility]astroenterology Patient Name: Jerry Conley Procedure Date: 11/26/2023 10:35 AM MRN: [id number] Account #: [id number] Date of Birth: [birth year] Admit Type: Outpatient Age: 57 Room: [facility] ENDO ROOM 4 Gender: Male Note Status: Finalized Instrument Name: Prentice Docker [id number] Procedure:             Colonoscopy Indications:           High risk colon cancer surveillance: Personal history                         of non-advanced adenoma Providers:             Boykin Nearing. Norma Fredrickson MD, MD Referring MD:          Marina Goodell (Referring MD) Medicines:             Propofol per Anesthesia Complications:         No immediate complications. Estimated blood loss: None. Procedure:             Pre-Anesthesia Assessment:                        - The risks and benefits of the procedure and the                         sedation options and risks were discussed with the                         patient. All questions were answered and informed                         consent was obtained.                        - Patient identification and proposed procedure were                         verified prior to the procedure by the nurse. The                         procedure was verified in the procedure room.                        - ASA Grade Assessment: III - A patient with severe                         systemic disease.                        - After reviewing the risks and benefits, the patient                         was deemed in satisfactory condition to undergo the                         procedure.                        After obtaining informed consent, the colonoscope was  passed under direct vision. Throughout the procedure,                         the patient's blood pressure, pulse, and oxygen                         saturations were monitored continuously. The                         Colonoscope was introduced through the anus and                          advanced to the the cecum, identified by appendiceal                         orifice and ileocecal valve. The colonoscopy was                         technically difficult and complex due to a redundant                         colon, significant looping and a tortuous colon.                         Successful completion of the procedure was aided by                         applying abdominal pressure. The patient tolerated the                         procedure well. The quality of the bowel preparation                         was good. The ileocecal valve, the appendiceal orifice                         and Crow's foot were photographed. Findings:      The perianal and digital rectal examinations were normal. Pertinent       negatives include normal sphincter tone and no palpable rectal lesions.      The right colon was significantly tortuous. Advancing the scope required       using scope torsion and applying abdominal pressure. Estimated blood       loss: none.      Normal mucosa was found in the entire colon.      There is no endoscopic evidence of bleeding, diverticula, mass, polyps       or ulcerations in the entire colon.      The retroflexed view of the distal rectum and anal verge was normal and       showed no anal or rectal abnormalities. Impression:            - Tortuous colon.                        - Normal mucosa in the entire examined colon.                        - The distal rectum and anal verge are normal on  retroflexion view.                        - No specimens collected. Recommendation:        - Patient has a contact number available for                         emergencies. The signs and symptoms of potential                         delayed complications were discussed with the patient.                         Return to normal activities tomorrow. Written                         discharge instructions were provided to the  patient.                        - Resume previous diet.                        - Continue present medications.                        - Repeat colonoscopy in 10 years for screening                         purposes.                        - Return to GI office PRN.                        - The findings and recommendations were discussed with                         the patient. Procedure Code(s):     --- Professional ---                        G3151, Colorectal cancer screening; colonoscopy on                         individual at high risk Diagnosis Code(s):     --- Professional ---                        Q43.8, Other specified congenital malformations of                         intestine                        Z86.010, Personal history of colonic polyps CPT copyright 2022 American Medical Association. All rights reserved. The codes documented in this report are preliminary and upon coder review may  be revised to meet current compliance requirements. Stanton Kidney MD, MD 11/26/2023 11:10:19 AM This report has been signed electronically. Number of Addenda: 0 Note Initiated On: 11/26/2023 10:35 AM Scope Withdrawal Time: 0 hours 6 minutes 57 seconds  Total Procedure Duration: 0 hours 23 minutes 52 seconds  Estimated Blood Loss:  Estimated blood loss: none. Estimated blood loss: none.      [facility]

## 2024-08-20 NOTE — Progress Notes (Signed)
 08/24/2024 9:07 AM   Alm Conley Jerry Teddie [birth year] [id number]  Referring provider: Jeffie Cheryl BRAVO, MD [address] [place],  [place] [address]  Urological history: 1. Phimosis - managed with betamethasone  dipropionate bid   2. BPH with LU TS - cysto (07/2023) -enlarged prostate bilobar coaptation/friable prostatic vessels  3. Nocturia -Risk factors for nocturia: obstructive sleep apnea (on CPAP) and BPH.    4. Prostate cancer screening -PSA (06/2024) 0.68 - AUA guidelines recommend prostate cancer screening for men 88 to 57 years of age at leasevery two to four years - African ancestry  - father and paternal grandfather with prostate cancer  5. High risk hematuria - non-smoker - CTU and cysto 2019 - mildly enlarged prostate with bilobar coaptation, friable prostatic vessels appreciated - RUS (07/2021) - 12 mm left renal cyst - CTU (07/2022) - NED - cysto (07/2022) - friable prostate   6.  Erectile dysfunction - Sildenafil  20 mg, 1 to 5 tablets 2 hours prior to intercourse  HPI: Jerry Conley. is a 58 y.o. male who presents today for yearly follow up.   Previous records reviewed.    I PSS 11/3  He reports sensation of incomplete bladder emptying, urinary frequency, urinary intermittency, urinary urgency, a weak urinary stream, having to strain to void and nocturia x 2.  He has a very rare occasion where he has the feeling of incomplete bladder emptying, frequency and straining to urinate.  Patient denies any modifying or aggravating factors.  Patient denies any recent UTI's, gross hematuria, dysuria or suprapubic/flank pain.  Patient denies any fevers, chills, nausea or vomiting.  His father has a history of prostate cancer with recurrence that is currently under good biochemical control.  UA pending   PSA (06/2024) 0.68  Serum creatinine (06/2024) 0.9, eGFR 100  SHIM 17  He does not have confidence that he could get and keep an erection, his erections are  not firm enough for penetrative intercourse, he has difficulty maintaining his erections,  and he is not finding intercourse satisfactory for him.  Patient is not having spontaneous erections.  He denies any pain or curvature with erections.  Cholesterol (06/2024) normal.  TSH (06/2024) 1.397.  He has not tried the sildenafil  as he still concerned with heart issues that it may cause.     PMH: Past Medical History:  Diagnosis Date   Hypertension    Obesity    Sleep apnea     Surgical History: Past Surgical History:  Procedure Laterality Date   COLONOSCOPY WITH PROPOFOL  N/A 06/04/2018   Procedure: COLONOSCOPY WITH PROPOFOL ;  Surgeon: Viktoria Lamar DASEN, MD;  Location[facility] ENDOSCOPY;  Service: Endoscopy;  Laterality: N/A;   COLONOSCOPY WITH PROPOFOL  N/A 11/26/2023   Procedure: COLONOSCOPY WITH PROPOFOL ;  Surgeon: Toledo, Ladell POUR, MD;  Location: [facility] ENDOSCOPY;  Service: Gastroenterology;  Laterality: N/A;   THROAT SURGERY      Home Medications:  Allergies as of 08/24/2024   No Known Allergies      Medication List        Accurate as of August 24, 2024  9:07 AM. If you have any questions, ask your nurse or doctor.          betamethasone  dipropionate 0.05 % cream Apply topically 2 (two) times daily.   ONE-A-DAY SCOOBY-DOO GUMMIES PO Take by mouth.   sildenafil  20 MG tablet Commonly known as: REVATIO  Take 1 to 5 tablets two hours before intercouse on an empty stomach.  Do  not take with nitrates.        Allergies: No Known Allergies  Family History: Family History  Problem Relation Age of Onset   Prostate cancer Father    Kidney cancer Neg Hx    Bladder Cancer Neg Hx     Social History:  reports that he has never smoked. He has never used smokeless tobacco. He reports current alcohol use. He reports that he does not use drugs.  ROS: For pertinent review of systems please refer to history of present illness  Physical Exam: BP 127/72 (BP Location: Left Arm,  Patient Position: Sitting, Cuff Size: Large)   Pulse 97   Wt (!) 361 lb 6.4 oz (163.9 kg)   SpO2 98%   BMI 47.68 kg/m   Constitutional:  Well nourished. Alert and oriented, No acute distress. HEENT: [place] AT, moist mucus membranes.  Trachea midline Cardiovascular: No clubbing, cyanosis, or edema. Respiratory: Normal respiratory effort, no increased work of breathing. GU: No CVA tenderness.  No bladder fullness or masses.  Patient with uncircumcised phallus. Foreskin easily retracted  Urethral meatus is patent.  No penile discharge. No penile lesions or rashes.  Vitiligo was present on the glans.  Scrotum without lesions, cysts, rashes and/or edema.  Testicles are located scrotally bilaterally. No masses are appreciated in the testicles. Left and right epididymis are normal. Rectal: Patient with  normal sphincter tone. Anus and perineum without scarring or rashes. No rectal masses are appreciated. Prostate is approximately 50 + grams, could not palpate the entire gland due to body habitus, no nodules are appreciated. Seminal vesicles could not be palpated.  Neurologic: Grossly intact, no focal deficits, moving all 4 extremities. Psychiatric: Normal mood and affect.   Laboratory Data: See Epic and HPI   I have reviewed the labs.  Assessment & Plan:    1. BPH with LUTS -PSA normal -DRE benign  -UA pending  -continue conservative management, avoiding bladder irritants and timed voiding's   2. Prostate cancer screening -up to date -continue annual screening due to risk factors of family history of prostate cancer and African ancestry  3. High risk hematuria -non-smoker -work up 2019 - friable prostate -had micro heme again in 07/2021 - RUS (07/2021) - small cyst - patient refused repeat cysto  -no reports of gross hematuria  -work up 2023 - friable prostate -UA pending   4. Erectile dysfunction -He is nervous about taking p.o. medications for his ED, we did discuss ICI, but he  deferred, we also discussed checking a testosterone level and correcting any hypogonadism issue understanding that this may not treat the erectile dysfunction, he deferred - We discussed the sildenafil  and its safety profile and he is asked to try that medication -Sildenafil  20 mg, 3 to 5 tablets two hours prior to intercourse on an empty stomach, # 50; he is warned not to take medications that contain nitrates.  I also advised him of the side effects, such as: headache, flushing, dyspepsia, abnormal vision, nasal congestion, back pain, myalgia, nausea, dizziness, and rash.   5. Vitiligo - Offered referral to dermatology for further evaluation and management, but he stated he would like to think about that, he may call back for referral  Return in about 1 year (around 08/24/2025) for PSA, I PSS, SHIM .  These notes generated with voice recognition software. I apologize for typographical errors.  CLOTILDA HELON RIGGERS  [facility] [address] [place], [place] [address] [phone]

## 2024-08-24 ENCOUNTER — Ambulatory Visit (INDEPENDENT_AMBULATORY_CARE_PROVIDER_SITE_OTHER): Payer: Self-pay | Admitting: Urology

## 2024-08-24 ENCOUNTER — Other Ambulatory Visit
Admission: RE | Admit: 2024-08-24 | Discharge: 2024-08-24 | Disposition: A | Discharge status: Home / Self Care | Attending: Urology | Admitting: Urology

## 2024-08-24 ENCOUNTER — Encounter: Payer: Self-pay | Admitting: Urology

## 2024-08-24 ENCOUNTER — Ambulatory Visit: Payer: Self-pay | Admitting: Urology

## 2024-08-24 ENCOUNTER — Other Ambulatory Visit: Payer: Self-pay

## 2024-08-24 VITALS — BP 127/72 | HR 97 | Wt 361.4 lb

## 2024-08-24 DIAGNOSIS — N529 Male erectile dysfunction, unspecified: Secondary | ICD-10-CM

## 2024-08-24 DIAGNOSIS — N401 Enlarged prostate with lower urinary tract symptoms: Secondary | ICD-10-CM

## 2024-08-24 DIAGNOSIS — N138 Other obstructive and reflux uropathy: Secondary | ICD-10-CM | POA: Diagnosis present

## 2024-08-24 DIAGNOSIS — R319 Hematuria, unspecified: Secondary | ICD-10-CM | POA: Diagnosis not present

## 2024-08-24 DIAGNOSIS — L80 Vitiligo: Secondary | ICD-10-CM

## 2024-08-24 LAB — URINALYSIS, COMPLETE (UACMP) WITH MICROSCOPIC
Bacteria, UA: NONE SEEN
Bilirubin Urine: NEGATIVE
Glucose, UA: NEGATIVE mg/dL
Hgb urine dipstick: NEGATIVE
Ketones, ur: NEGATIVE mg/dL
Leukocytes,Ua: NEGATIVE
Nitrite: NEGATIVE
Protein, ur: NEGATIVE mg/dL
Specific Gravity, Urine: 1.026 (ref 1.005–1.030)
pH: 5 (ref 5.0–8.0)

## 2024-08-24 MED ORDER — SILDENAFIL CITRATE 20 MG PO TABS: ORAL_TABLET | ORAL | 3 refills | Status: AC

## 2024-08-28 ENCOUNTER — Other Ambulatory Visit: Payer: Self-pay | Admitting: Urology

## 2024-08-28 DIAGNOSIS — N471 Phimosis: Secondary | ICD-10-CM

## 2025-08-24 ENCOUNTER — Ambulatory Visit: Admitting: Urology
